# Patient Record
Sex: Male | Born: 1940
Health system: Southern US, Community
[De-identification: ages and names within clinical notes are randomized; demographics above are authoritative.]

## PROBLEM LIST (undated history)

## (undated) DIAGNOSIS — G4733 Obstructive sleep apnea (adult) (pediatric): Secondary | ICD-10-CM

## (undated) DIAGNOSIS — Z9989 Dependence on other enabling machines and devices: Secondary | ICD-10-CM

## (undated) DIAGNOSIS — I2510 Atherosclerotic heart disease of native coronary artery without angina pectoris: Secondary | ICD-10-CM

## (undated) DIAGNOSIS — E785 Hyperlipidemia, unspecified: Secondary | ICD-10-CM

## (undated) HISTORY — DX: Atherosclerotic heart disease of native coronary artery without angina pectoris: I25.10

## (undated) HISTORY — DX: Dependence on other enabling machines and devices: Z99.89

## (undated) HISTORY — DX: Hyperlipidemia, unspecified: E78.5

## (undated) HISTORY — DX: Obstructive sleep apnea (adult) (pediatric): G47.33

---

## 2000-11-30 ENCOUNTER — Emergency Department (HOSPITAL_COMMUNITY): Admission: EM | Admit: 2000-11-30 | Discharge: 2000-11-30 | Payer: Self-pay | Admitting: Internal Medicine

## 2005-05-06 ENCOUNTER — Ambulatory Visit (HOSPITAL_COMMUNITY): Admission: RE | Admit: 2005-05-06 | Discharge: 2005-05-06 | Payer: Self-pay | Admitting: Family Medicine

## 2005-05-15 ENCOUNTER — Ambulatory Visit (HOSPITAL_COMMUNITY): Admission: RE | Admit: 2005-05-15 | Discharge: 2005-05-15 | Payer: Self-pay | Admitting: Family Medicine

## 2006-06-30 ENCOUNTER — Ambulatory Visit (HOSPITAL_COMMUNITY): Admission: RE | Admit: 2006-06-30 | Discharge: 2006-06-30 | Payer: Self-pay | Admitting: Internal Medicine

## 2006-06-30 ENCOUNTER — Ambulatory Visit: Payer: Self-pay | Admitting: Internal Medicine

## 2006-07-24 ENCOUNTER — Ambulatory Visit (HOSPITAL_COMMUNITY): Admission: RE | Admit: 2006-07-24 | Discharge: 2006-07-24 | Payer: Self-pay | Admitting: *Deleted

## 2006-09-16 ENCOUNTER — Ambulatory Visit (HOSPITAL_COMMUNITY): Admission: RE | Admit: 2006-09-16 | Discharge: 2006-09-17 | Payer: Self-pay | Admitting: Cardiology

## 2006-09-16 HISTORY — PX: CORONARY ANGIOPLASTY WITH STENT PLACEMENT: SHX49

## 2006-10-19 ENCOUNTER — Ambulatory Visit: Admission: RE | Admit: 2006-10-19 | Discharge: 2006-10-19 | Payer: Self-pay | Admitting: *Deleted

## 2006-11-10 ENCOUNTER — Ambulatory Visit: Payer: Self-pay | Admitting: Pulmonary Disease

## 2007-10-29 ENCOUNTER — Encounter: Payer: Self-pay | Admitting: Pulmonary Disease

## 2007-12-04 ENCOUNTER — Encounter: Payer: Self-pay | Admitting: Pulmonary Disease

## 2007-12-21 ENCOUNTER — Ambulatory Visit: Payer: Self-pay | Admitting: Pulmonary Disease

## 2007-12-21 DIAGNOSIS — E785 Hyperlipidemia, unspecified: Secondary | ICD-10-CM | POA: Insufficient documentation

## 2007-12-21 DIAGNOSIS — I499 Cardiac arrhythmia, unspecified: Secondary | ICD-10-CM | POA: Insufficient documentation

## 2007-12-21 DIAGNOSIS — G4733 Obstructive sleep apnea (adult) (pediatric): Secondary | ICD-10-CM

## 2008-04-05 ENCOUNTER — Encounter: Payer: Self-pay | Admitting: Pulmonary Disease

## 2008-04-05 ENCOUNTER — Telehealth (INDEPENDENT_AMBULATORY_CARE_PROVIDER_SITE_OTHER): Payer: Self-pay | Admitting: *Deleted

## 2008-04-08 ENCOUNTER — Ambulatory Visit: Payer: Self-pay | Admitting: Pulmonary Disease

## 2008-04-14 ENCOUNTER — Emergency Department (HOSPITAL_COMMUNITY): Admission: EM | Admit: 2008-04-14 | Discharge: 2008-04-14 | Payer: Self-pay | Admitting: Emergency Medicine

## 2008-05-20 HISTORY — PX: US ECHOCARDIOGRAPHY: HXRAD669

## 2008-06-02 HISTORY — PX: NM MYOCAR PERF WALL MOTION: HXRAD629

## 2008-07-12 ENCOUNTER — Ambulatory Visit (HOSPITAL_COMMUNITY): Admission: RE | Admit: 2008-07-12 | Discharge: 2008-07-12 | Payer: Self-pay | Admitting: Internal Medicine

## 2010-07-02 LAB — PROTIME-INR: Prothrombin Time: 14 seconds (ref 11.6–15.2)

## 2010-07-02 LAB — HEMOGLOBIN AND HEMATOCRIT, BLOOD
HCT: 41.1 % (ref 39.0–52.0)
Hemoglobin: 14.3 g/dL (ref 13.0–17.0)

## 2010-07-31 NOTE — Cardiovascular Report (Signed)
[name], Tran                ACCOUNT NO.:  [id number]   MEDICAL RECORD NO.:  [id number]          PATIENT TYPE:  OIB   LOCATION:  6522                         FACILITY:  [facility]   PHYSICIAN:  Vonna Kotyk R. Jacinto Halim, MD       DATE OF BIRTH:  1940/08/24   DATE OF PROCEDURE:  09/16/2006  DATE OF DISCHARGE:                            CARDIAC CATHETERIZATION   REFERRING PHYSICIAN:  Catalina Pizza, M.D.   ATTENDING CARDIOLOGIST:  Dani Gobble, MD.   PROCEDURE PERFORMED:  1. Percutaneous transluminal cardiac angioplasty and stenting of the      proximal left anterior descending.  2. Percutaneous transluminal cardiac angioplasty and stenting of the      ramus intermediate coronary artery.   INDICATIONS:  Mr. Timothy Tran is a 70 year old gentleman who had  cardiac catheterization on September 02, 2006, for an abnormal stress  Myoview.  He has history of GERD, hyperlipidemia, hypertension and  palpitations.  He has been having frequent PVCs.  During diagnostic  cardiac catheterization on September 02, 2006, he was found to have a high-  grade proximal LAD and proximal ramus intermediate stenoses.  Now he is  brought to the cardiac catheterization lab for elective angioplasty to  both these vessels.   BRIEF ANGIOGRAPHIC DATA.:  Left main:  The left main is a large-caliber  vessel.  It has mild calcification.   LAD:  The LAD is a large-caliber vessel.  It has mild diffuse  calcification in the proximal segment.  There is a proximal 80% stenosis  in the proximal LAD followed by a tandem 40% stenosis.  Otherwise the  LAD has mild luminal irregularity.  It gives origin to several small  diagonals.   Ramus intermediate:  The ramus intermediate is a large-caliber vessel.  There is a high-grade proximal 99% stenosis.  It gives off a high  secondary branch and otherwise is smooth and normal.   Circumflex coronary artery:  The circumflex artery is a large-caliber  vessel.  It is smooth and normal.   INTERVENTION  DATA:  1. Successful PTCA and stenting of the proximal LAD with a 4.0 x 9 mm      MX Driver non-drug-eluting stent deployed at 12 atmospheres      pressure for 60 seconds and postdilated with a 4.0 x 8 mm Quantum      at 16 atmospheres of pressure.  The stenosis was reduced from 80%      to 0% with TIMI-3 to TIMI-3 flow maintained at the end of the      procedure.  There was no evidence of dissection or thrombus at the      end of the procedure.  2. Successful PTCA and stenting of the ramus intermediate coronary      artery with implantation of a 4.0 x 15 mm MX Driver.  This stent      was deployed at 10 atmospheres pressure for 60 seconds.  The      stenosis was reduced from 99% to 0% with TIMI-3 to TIMI-3 flow      maintained at the end  of the procedure.   RECOMMENDATIONS:  The patient will be continued on aggressive risk  modification.  He will be discharged home in the morning if he remains  stable.  A total of 165 mL of contrast was utilized for the  interventional procedure.   PROCEDURAL DATA:   EQUIPMENT UTILIZED:  7-French 3.5 CLS guide, ATW guidewire for the LAD,  Asahi Prowater into the ramus intermediate branch, 2.75 x 10 mm cutting  balloon for balloon angioplasty of the LAD and ramus intermediate,  Angiomax for anticoagulation, a 4.0 x 9 mm and 4.0 x 15 mm MX Driver  stent, and a 4.0 x 8 mm Quantum Maverick.   TECHNIQUE OF PROCEDURE:  Under usual sterile precautions, using a 7-  French right femoral arterial access, a 7-French 3.5 CLS guide was  advanced into the ascending aorta, and the left main coronary artery was  selectively engaged.  Angiography was performed.  Using Angiomax for  anticoagulation and using the ATW guidewire across the LAD,  the lesion  length was measured.  A 2.75 x 10 mm cutting balloon was utilized for  performing multiple cuts into the proximal LAD at 10 atmospheres  pressure x2.  Post cutting balloon angioplasty, which was performed for   50-60 seconds, a 4.0 x 9 mm MX Driver was advanced to the site of the  lesion and deployed at 12 atmospheres pressure for 60 seconds and  postdilated with a 4.0 x 8 mm Quantum Maverick at 14 atmospheres of  pressure for 40 seconds and 6 atmospheres of pressure for 40 seconds and  then angiography repeated.  Excellent results were noted.   Then attention was directed towards the ramus intermediate branch.  Using Asahi soft guidewire, I was able to advance through the ramus  intermediate branch with mild difficulty.  Then a 2.75 x 10 mm cutting  balloon was advanced to the site of the stenosis, and cutting balloon  angioplasty at 6 atmospheres of pressure for 45 seconds and second  inflation at 10 atmospheres of pressure for 50 seconds were performed.  After past passing the lesion with the balloon, the balloon was pre-  occlusive, and the stenosis was very critical.  After balloon  angioplasty, I decided to stent with a 4.0 x 15 mm MX Driver which was  deployed at 10 atmospheres of pressure for 60 seconds and angiography  repeated.  Excellent results were noted.  Excellent coverage of the  ostium of the ramus intermediate was noted.  No jeopardy of either the  ostial LAD or the circumflex occurred after the angioplasty.  At this  point, the guidewires were withdrawn out of the body and angiography  repeated.  Guide catheter disengaged and pulled out of body over the J-  wire.  The patient tolerated the procedure well.  No immediate  complications were noted.      Cristy Hilts. Jacinto Halim, MD  Electronically Signed     JRG/MEDQ  D:  09/16/2006  T:  09/17/2006  Job:  [id number]   cc:   Catalina Pizza, M.D.  Dani Gobble, MD

## 2010-08-03 NOTE — Op Note (Signed)
NAME:  Timothy Tran, Timothy Tran                ACCOUNT NO.:  [id number]   MEDICAL RECORD NO.:  [id number]          PATIENT TYPE:  AMB   LOCATION:  DAY                           FACILITY:  [facility]   PHYSICIAN:  R. Roetta Sessions, M.D. DATE OF BIRTH:  1940-11-15   DATE OF PROCEDURE:  06/30/2006  DATE OF DISCHARGE:                               OPERATIVE REPORT   PROCEDURE:  Screening colonoscopy.   INDICATIONS FOR PROCEDURE:  The patient is a 70 year old gentleman sent  over by the courtesy Dr. Catalina Pizza for colorectal cancer screening.  He  is devoid of any lower GI tract symptoms.  He has no family history of  colorectal neoplasia.  He has never had his lower GI tract imaged.  Colonoscopy is now being done.  This approach has been discussed with  the patient at length.  The potential risks, benefits, and alternatives  have been reviewed and questions answered.  He is agreeable.  Please see  the documentation in the medical record.   PROCEDURE:  O2 saturation, blood pressure, pulse, and respirations were  monitored throughout the entire procedure.  Conscious sedation:  Total  of Versed 3 mg IV, Demerol 75 mg IV.   Please note that the patient was somewhat bradycardic in the 50s prior  to the procedure in the short-stay center preoperative area.   Instrument:  Pentax video chip system.   FINDINGS:  Digital rectal exam revealed no abnormalities.  Endoscopic  findings:  The prep was fair to marginal.  The colonic mucosa was  surveyed from the rectosigmoid junction through the left, transverse,  and right colon to area of the appendiceal orifice, ileocecal valve, and  cecum.  These structures were eventually seen very well and photographed  for the record.  From this level, the scope was slowly withdrawn.  All  previously mentioned mucosal surfaces were again seen.  The patient had  extensive left-sided diverticula.  The remainder of the colonic mucosa  appeared normal.  Right off the bat upon  entering the sigmoid colon, the  patient became more bradycardic into the 30s and pressure dropped  transiently into the 70 systolic range.  However, he remained alert and  conversant and comfortable.  Because he was already bradycardic, I  elected to go ahead and just give him some atropine 0.25 mg.  This  resulted in increased heart rate into the 50s and 60s, and his pressure  came back up with a systolic over 100.  His colon was somewhat tortuous  and elongated, and the poor prep made the exam more difficult.  Copious  washings took place during the procedure to gain adequate visualization  of the colonic mucosa.  Again, extensive left-sided diverticula.  The  remainder of the colonic mucosa appeared normal.  The scope was pulled  down into the rectum where thorough examination of the rectal mucosa  including retroflex view of the anal verge revealed no abnormalities.  The patient tolerated the procedure well and otherwise was reactive in  endoscopy.   IMPRESSION:  1. Normal rectum.  2. Extensive left-sided diverticula.  The  remainder of the colonic      mucosa appeared normal.  3. Bradycardia before and during the procedure, as above.  He was not      bradycardic following the procedure.  I wonder if he has an element      of sick sinus syndrome.  He has really not had any cardiac-type      symptoms.  He is not on any drugs that would slow his heart rate      down.   RECOMMENDATIONS:  1. Diverticulosis literature provided to Timothy Tran.  2. Repeat screening colonoscopy in 10 years.  3. I recommend that Timothy Tran at least touch base with Dr. Margo Aye in      the near future, noting his episode of bradycardia and see if      anything else should be done for this nice gentleman.      Timothy Tran, M.D.  Electronically Signed     RMR/MEDQ  D:  06/30/2006  T:  06/30/2006  Job:  [id number]   cc:   Catalina Pizza, M.D.  Fax: [phone]

## 2010-08-03 NOTE — Procedures (Signed)
Timothy Tran, Timothy Tran                ACCOUNT NO.:  [id number]   MEDICAL RECORD NO.:  [id number]          PATIENT TYPE:  OUT   LOCATION:  SLEEP LAB                     FACILITY:  [facility]   PHYSICIAN:  Barbaraann Share, MD,FCCPDATE OF BIRTH:  08/21/1939   DATE OF STUDY:  10/29/2006                            NOCTURNAL POLYSOMNOGRAM   REFERRING PHYSICIAN:   REFERRING PHYSICIAN:  Dr. Kem Boroughs   INDICATION FOR THE STUDY:  Hypersomnia with sleep apnea.   EPWORTH SCORE:  5   SLEEP ARCHITECTURE:  The patient had a total sleep time of 315 minutes  with decreased REM as well as slow wave sleep.  Sleep onset latency was  normal and REM onset was normal as well.  Sleep efficiency was decreased  at 74%.   RESPIRATORY DATA:  The patient underwent split-night protocol where he  was noted to have 43 obstructive events in the first 105 minutes of  sleep.  This gives him an extrapolated apnea/hypopnea index of 25 events  per hour over the first half of the night.  The events were clearly  worse during supine REM and there was loud snoring noted throughout.  By  protocol, the patient was then placed on a medium Comfort Select CPAP  mask and ultimately titrated to a final pressure of 7 cm of water  pressure with good control of both events and snoring.   OXYGEN DATA:  There was O2 desaturation as low as 89% with the patient's  obstructive events.   CARDIAC DATA:  The patient was found to have sinus bradycardia  throughout but there was no significant worsening with his obstructive  events.   MOVEMENT/PARASOMNIA:  The patient was found have 228 leg jerks with 11  per hour resulting in arousal or awakening.  These occurred despite  adequate CPAP.   IMPRESSION/RECOMMENDATIONS:  1. Split-night study reveals moderate obstructive sleep apnea during      the first half of the night with an apnea/hypopnea index of 25      events per hour and oxygen desaturation as low as 89%.  The patient      was  then placed on CPAP with a medium Comfort Select nasal CPAP      mask and titrated to a final pressure of 7 cm of water pressure.  2. Sinus bradycardia noted throughout with no worsening during the      patient's obstructive events.  3. Very large numbers of leg jerks with what appears to be significant      sleep disruption.  This may simply be due to the patient's sleep      disordered breathing; however, there is some suspicion he may also      have a concomitant      primary movement disorder of sleep.  Clinical correlation is      suggested if the patient does not fully respond to optimal CPAP.      Barbaraann Share, MD,FCCP  Diplomate, American Board of Sleep  Medicine  Electronically Signed     KMC/MEDQ  D:  10/29/2006 15:45:28  T:  10/30/2006 19:45:56  Job:  [id number] 

## 2011-01-01 LAB — CBC
Hemoglobin: 13.8
MCV: 87.1
Platelets: 140 — ABNORMAL LOW

## 2011-01-01 LAB — BASIC METABOLIC PANEL
BUN: 13
CO2: 26
Chloride: 109
GFR calc Af Amer: >60

## 2011-01-01 LAB — CARDIAC PANEL(CRET KIN+CKTOT+MB+TROPI): Total CK: 95

## 2011-12-11 DIAGNOSIS — Z23 Encounter for immunization: Secondary | ICD-10-CM | POA: Diagnosis not present

## 2012-04-10 DIAGNOSIS — I2510 Atherosclerotic heart disease of native coronary artery without angina pectoris: Secondary | ICD-10-CM | POA: Diagnosis not present

## 2012-04-10 DIAGNOSIS — G4733 Obstructive sleep apnea (adult) (pediatric): Secondary | ICD-10-CM | POA: Diagnosis not present

## 2012-04-10 DIAGNOSIS — E782 Mixed hyperlipidemia: Secondary | ICD-10-CM | POA: Diagnosis not present

## 2012-06-24 DIAGNOSIS — Z79899 Other long term (current) drug therapy: Secondary | ICD-10-CM | POA: Diagnosis not present

## 2012-06-24 DIAGNOSIS — E782 Mixed hyperlipidemia: Secondary | ICD-10-CM | POA: Diagnosis not present

## 2012-12-07 DIAGNOSIS — Z23 Encounter for immunization: Secondary | ICD-10-CM | POA: Diagnosis not present

## 2013-03-23 DIAGNOSIS — H2510 Age-related nuclear cataract, unspecified eye: Secondary | ICD-10-CM | POA: Diagnosis not present

## 2013-06-10 DIAGNOSIS — R7309 Other abnormal glucose: Secondary | ICD-10-CM | POA: Diagnosis not present

## 2013-06-10 DIAGNOSIS — Z125 Encounter for screening for malignant neoplasm of prostate: Secondary | ICD-10-CM | POA: Diagnosis not present

## 2013-06-10 DIAGNOSIS — I10 Essential (primary) hypertension: Secondary | ICD-10-CM | POA: Diagnosis not present

## 2013-06-15 DIAGNOSIS — K219 Gastro-esophageal reflux disease without esophagitis: Secondary | ICD-10-CM | POA: Diagnosis not present

## 2013-06-15 DIAGNOSIS — E782 Mixed hyperlipidemia: Secondary | ICD-10-CM | POA: Diagnosis not present

## 2013-06-15 DIAGNOSIS — I2510 Atherosclerotic heart disease of native coronary artery without angina pectoris: Secondary | ICD-10-CM | POA: Diagnosis not present

## 2013-06-15 DIAGNOSIS — R7301 Impaired fasting glucose: Secondary | ICD-10-CM | POA: Diagnosis not present

## 2013-07-01 ENCOUNTER — Encounter: Payer: Self-pay | Admitting: *Deleted

## 2013-07-05 ENCOUNTER — Encounter: Payer: Self-pay | Admitting: Cardiovascular Disease

## 2013-07-05 ENCOUNTER — Ambulatory Visit (INDEPENDENT_AMBULATORY_CARE_PROVIDER_SITE_OTHER): Payer: Medicare Other | Admitting: Cardiovascular Disease

## 2013-07-05 VITALS — BP 134/82 | HR 55 | Ht 70.0 in | Wt 197.4 lb

## 2013-07-05 DIAGNOSIS — E785 Hyperlipidemia, unspecified: Secondary | ICD-10-CM | POA: Diagnosis not present

## 2013-07-05 DIAGNOSIS — I2510 Atherosclerotic heart disease of native coronary artery without angina pectoris: Secondary | ICD-10-CM

## 2013-07-05 NOTE — Assessment & Plan Note (Signed)
On statin therapy followed by his PCP 

## 2013-07-05 NOTE — Patient Instructions (Signed)
Your physician recommends that you schedule a follow-up appointment in:  One year to see Dr. Gwenlyn Found

## 2013-07-05 NOTE — Progress Notes (Signed)
07/05/2013 Timothy Tran   30-Dec-1940  [id number]  Primary Physician Delphina Cahill, MD Primary Cardiologist: Lorretta Harp MD Renae Gloss   HPI:  The patient is a delightful thin and fit-appearing 73 year old married Caucasian male, father of 33, grandfather of 59 grandchildren, whom I last saw a year ago. He has a history of CAD, status post LAD and ramus branch stenting with bare-metal stents September 16, 2006. He has mixed dyslipidemia, obstructive sleep apnea, status post nasal palatopharyngoplasty. Denies chest pain or shortness of breath.he has a history of hyperlipidemia on statin therapy followed by his PCP.   Current Outpatient Prescriptions  Medication Sig Dispense Refill  . ALPRAZolam (XANAX) 0.5 MG tablet Take 0.5 mg by mouth at bedtime as needed for anxiety.      Marland Kitchen aspirin 81 MG tablet Take 81 mg by mouth daily.      Marland Kitchen atorvastatin (LIPITOR) 40 MG tablet Take 40 mg by mouth daily.      . Cholecalciferol (VITAMIN D-3) 5000 UNITS TABS Take 5,000 Units by mouth daily.      . DULoxetine (CYMBALTA) 60 MG capsule Take 60 mg by mouth daily.      . fenofibrate 160 MG tablet Take 160 mg by mouth daily.      Marland Kitchen L-Lysine 500 MG CAPS Take 500 mg by mouth daily.      . Omega-3 Fatty Acids (FISH OIL) 1200 MG CAPS Take 1,200 mg by mouth daily.      Marland Kitchen omeprazole (PRILOSEC) 20 MG capsule Take 20 mg by mouth daily.      . tamsulosin (FLOMAX) 0.4 MG CAPS capsule Take 0.4 mg by mouth daily.      . traZODone (DESYREL) 50 MG tablet Take 50 mg by mouth daily.      Marland Kitchen zolpidem (AMBIEN CR) 12.5 MG CR tablet Take 12.5 mg by mouth at bedtime as needed for sleep.       No current facility-administered medications for this visit.    No Known Allergies  History   Social History  . Marital Status: Married    Spouse Name: N/A    Number of Children: N/A  . Years of Education: N/A   Occupational History  . Not on file.   Social History Main Topics  . Smoking status: Former Smoker   Quit date: 03/17/1980  . Smokeless tobacco: Not on file  . Alcohol Use: No  . Drug Use: No  . Sexual Activity: Not on file   Other Topics Concern  . Not on file   Social History Narrative  . No narrative on file     Review of Systems: General: negative for chills, fever, night sweats or weight changes.  Cardiovascular: negative for chest pain, dyspnea on exertion, edema, orthopnea, palpitations, paroxysmal nocturnal dyspnea or shortness of breath Dermatological: negative for rash Respiratory: negative for cough or wheezing Urologic: negative for hematuria Abdominal: negative for nausea, vomiting, diarrhea, bright red blood per rectum, melena, or hematemesis Neurologic: negative for visual changes, syncope, or dizziness All other systems reviewed and are otherwise negative except as noted above.    Blood pressure 134/82, pulse 55, height 5\' 10"  (1.778 m), weight 197 lb 6.4 oz (89.54 kg).  General appearance: alert and no distress Neck: no adenopathy, no carotid bruit, no JVD, supple, symmetrical, trachea midline and thyroid not enlarged, symmetric, no tenderness/mass/nodules Lungs: clear to auscultation bilaterally Heart: regular rate and rhythm, S1, S2 normal, no murmur, click, rub or gallop Extremities: extremities  normal, atraumatic, no cyanosis or edema  EKG sinus bradycardia at 55 without ST or T wave changes  ASSESSMENT AND PLAN:   Coronary artery disease Status post LAD and ramus branch stenting with bare-metal stent 09/16/06. Myoview performed here his liver was nonischemic. The patient denies chest pain or shortness of breath.  HYPERLIPIDEMIA On statin therapy followed by his PCP      Lorretta Harp MD [facility], [facility] 07/05/2013 11:40 AM

## 2013-07-05 NOTE — Assessment & Plan Note (Signed)
Status post LAD and ramus branch stenting with bare-metal stent 09/16/06. Myoview performed here his liver was nonischemic. The patient denies chest pain or shortness of breath.

## 2013-11-23 DIAGNOSIS — D485 Neoplasm of uncertain behavior of skin: Secondary | ICD-10-CM | POA: Diagnosis not present

## 2013-11-23 DIAGNOSIS — L570 Actinic keratosis: Secondary | ICD-10-CM | POA: Diagnosis not present

## 2013-11-23 DIAGNOSIS — L988 Other specified disorders of the skin and subcutaneous tissue: Secondary | ICD-10-CM | POA: Diagnosis not present

## 2013-11-23 DIAGNOSIS — L821 Other seborrheic keratosis: Secondary | ICD-10-CM | POA: Diagnosis not present

## 2014-01-05 DIAGNOSIS — E785 Hyperlipidemia, unspecified: Secondary | ICD-10-CM | POA: Diagnosis not present

## 2014-01-07 DIAGNOSIS — R7301 Impaired fasting glucose: Secondary | ICD-10-CM | POA: Diagnosis not present

## 2014-01-07 DIAGNOSIS — E782 Mixed hyperlipidemia: Secondary | ICD-10-CM | POA: Diagnosis not present

## 2014-01-07 DIAGNOSIS — I10 Essential (primary) hypertension: Secondary | ICD-10-CM | POA: Diagnosis not present

## 2014-01-07 DIAGNOSIS — I2510 Atherosclerotic heart disease of native coronary artery without angina pectoris: Secondary | ICD-10-CM | POA: Diagnosis not present

## 2014-07-21 DIAGNOSIS — R7301 Impaired fasting glucose: Secondary | ICD-10-CM | POA: Diagnosis not present

## 2014-07-21 DIAGNOSIS — E782 Mixed hyperlipidemia: Secondary | ICD-10-CM | POA: Diagnosis not present

## 2014-07-21 DIAGNOSIS — I10 Essential (primary) hypertension: Secondary | ICD-10-CM | POA: Diagnosis not present

## 2014-07-25 DIAGNOSIS — K219 Gastro-esophageal reflux disease without esophagitis: Secondary | ICD-10-CM | POA: Diagnosis not present

## 2014-07-25 DIAGNOSIS — E782 Mixed hyperlipidemia: Secondary | ICD-10-CM | POA: Diagnosis not present

## 2014-07-25 DIAGNOSIS — I10 Essential (primary) hypertension: Secondary | ICD-10-CM | POA: Diagnosis not present

## 2014-08-02 ENCOUNTER — Ambulatory Visit: Payer: 59 | Admitting: Cardiovascular Disease

## 2014-09-20 ENCOUNTER — Ambulatory Visit (INDEPENDENT_AMBULATORY_CARE_PROVIDER_SITE_OTHER): Payer: Medicare Other | Admitting: Cardiovascular Disease

## 2014-09-20 ENCOUNTER — Encounter: Payer: Self-pay | Admitting: Cardiovascular Disease

## 2014-09-20 VITALS — BP 132/70 | HR 53 | Ht 70.0 in | Wt 185.1 lb

## 2014-09-20 DIAGNOSIS — Z79899 Other long term (current) drug therapy: Secondary | ICD-10-CM

## 2014-09-20 DIAGNOSIS — I2510 Atherosclerotic heart disease of native coronary artery without angina pectoris: Secondary | ICD-10-CM | POA: Diagnosis not present

## 2014-09-20 DIAGNOSIS — I2583 Coronary atherosclerosis due to lipid rich plaque: Principal | ICD-10-CM

## 2014-09-20 DIAGNOSIS — E785 Hyperlipidemia, unspecified: Secondary | ICD-10-CM | POA: Diagnosis not present

## 2014-09-20 MED ORDER — ATORVASTATIN CALCIUM 80 MG PO TABS: 80.0000 mg | ORAL_TABLET | Freq: Every day | ORAL | Status: DC

## 2014-09-20 NOTE — Progress Notes (Signed)
09/20/2014 Timothy Tran   10/15/1940  [id number]  Primary Physician Delphina Cahill, MD Primary Cardiologist: Lorretta Harp MD Renae Gloss   HPI:  The patient is a delightful thin and fit-appearing 74 year old married Caucasian male, father of 62, grandfather of 23 grandchildren, whom I last saw a year ago. He is accompanied by his wife today. He has a history of CAD, status post LAD and ramus branch stenting with bare-metal stents September 16, 2006. He has mixed dyslipidemia, obstructive sleep apnea, status post nasal palatopharyngoplasty. Denies chest pain or shortness of breath.He has a history of hyperlipidemia on statin therapy followed by his PCP.his most recent lipid profile performed 07/21/14 revealed a total cholesterol 201, LDL of 134 HDL 48   Current Outpatient Prescriptions  Medication Sig Dispense Refill  . ALPRAZolam (XANAX) 0.5 MG tablet Take 0.5 mg by mouth at bedtime as needed for anxiety.    Marland Kitchen aspirin 81 MG tablet Take 81 mg by mouth daily.    Marland Kitchen atorvastatin (LIPITOR) 40 MG tablet Take 40 mg by mouth daily.    . Cholecalciferol (VITAMIN D-3) 5000 UNITS TABS Take 5,000 Units by mouth daily.    . DULoxetine (CYMBALTA) 60 MG capsule Take 60 mg by mouth daily.    . fenofibrate 160 MG tablet Take 160 mg by mouth daily.    Marland Kitchen L-Lysine 500 MG CAPS Take 500 mg by mouth daily.    . Omega-3 Fatty Acids (FISH OIL) 1200 MG CAPS Take 1,200 mg by mouth daily.    Marland Kitchen omeprazole (PRILOSEC) 20 MG capsule Take 20 mg by mouth daily.    . tamsulosin (FLOMAX) 0.4 MG CAPS capsule Take 0.4 mg by mouth daily.    . traZODone (DESYREL) 50 MG tablet Take 50 mg by mouth daily.    Marland Kitchen zolpidem (AMBIEN CR) 12.5 MG CR tablet Take 12.5 mg by mouth at bedtime as needed for sleep.     No current facility-administered medications for this visit.    No Known Allergies  History   Social History  . Marital Status: Married    Spouse Name: N/A  . Number of Children: N/A  . Years of Education: N/A     Occupational History  . Not on file.   Social History Main Topics  . Smoking status: Former Smoker    Quit date: 03/17/1980  . Smokeless tobacco: Not on file  . Alcohol Use: No  . Drug Use: No  . Sexual Activity: Not on file   Other Topics Concern  . Not on file   Social History Narrative     Review of Systems: General: negative for chills, fever, night sweats or weight changes.  Cardiovascular: negative for chest pain, dyspnea on exertion, edema, orthopnea, palpitations, paroxysmal nocturnal dyspnea or shortness of breath Dermatological: negative for rash Respiratory: negative for cough or wheezing Urologic: negative for hematuria Abdominal: negative for nausea, vomiting, diarrhea, bright red blood per rectum, melena, or hematemesis Neurologic: negative for visual changes, syncope, or dizziness All other systems reviewed and are otherwise negative except as noted above.    Blood pressure 132/70, pulse 53, height 5\' 10"  (1.778 m), weight 185 lb 1.6 oz (83.961 kg).  General appearance: alert and no distress Neck: no adenopathy, no carotid bruit, no JVD, supple, symmetrical, trachea midline and thyroid not enlarged, symmetric, no tenderness/mass/nodules Lungs: clear to auscultation bilaterally Heart: regular rate and rhythm, S1, S2 normal, no murmur, click, rub or gallop Extremities: extremities normal, atraumatic, no cyanosis or  edema  EKG sinus bradycardia 53 without ST or T-wave changes. He did have PACs. I personally reviewed this EKG  ASSESSMENT AND PLAN:   HYPERLIPIDEMIA History of hyperlipidemia on atorvastatin 40 mg a day with recent lipid profile performed 5/55/15 revealing a total cholesterol of 201, LDL 134 and HDL of 48.  Coronary artery disease History of coronary artery disease status post LAD and ramus branch stenting with bare metal stents by myself 09/16/06. He denies chest pain or shortness of breath.      Lorretta Harp MD FACP,FACC,FAHA,  [facility] 09/20/2014 10:24 AM

## 2014-09-20 NOTE — Patient Instructions (Addendum)
Dr Gwenlyn Found has recommended making the following medication changes: INCREASE Atorvastatin to 80 mg. You make take 2 tablets of your current dose until the bottle is empty. A new prescription has been sent to your pharmacy electronically for the increased dose. Please follow the instructions listed on the bottle.  Your physician recommends that you return for lab work in 2 months - FASTING.  Your physician recommends that you schedule a follow-up appointment in 1 year IN [place]. You will receive a reminder letter in the mail two months in advance. If you don't receive a letter, please call our office to schedule the follow-up appointment.

## 2014-09-20 NOTE — Assessment & Plan Note (Signed)
History of coronary artery disease status post LAD and ramus branch stenting with bare metal stents by myself 09/16/06. He denies chest pain or shortness of breath.

## 2014-09-20 NOTE — Assessment & Plan Note (Signed)
History of hyperlipidemia on atorvastatin 40 mg a day with recent lipid profile performed 5/55/15 revealing a total cholesterol of 201, LDL 134 and HDL of 48.

## 2014-10-10 ENCOUNTER — Telehealth: Payer: Self-pay | Admitting: Cardiovascular Disease

## 2014-10-10 MED ORDER — ATORVASTATIN CALCIUM 80 MG PO TABS: 80.0000 mg | ORAL_TABLET | Freq: Every day | ORAL | Status: DC

## 2014-10-10 NOTE — Telephone Encounter (Signed)
Pt saw Dr Gwenlyn Found about 2 weeks ago,he was suppose to change his cholesterol medicine to a higher dosage. When she called the pharmacy at [facility] had not recived the change.

## 2014-10-10 NOTE — Telephone Encounter (Signed)
Spoke to patient, informed me that prescription not received by [place] pharmacy. Informed caller i would resubmit to them, they voiced understanding.  Rx for new lipitor dose sent to [facility] for #90 w/ refills. E-receipt confirmed.

## 2014-11-28 DIAGNOSIS — L821 Other seborrheic keratosis: Secondary | ICD-10-CM | POA: Diagnosis not present

## 2014-11-28 DIAGNOSIS — L570 Actinic keratosis: Secondary | ICD-10-CM | POA: Diagnosis not present

## 2014-11-28 DIAGNOSIS — D485 Neoplasm of uncertain behavior of skin: Secondary | ICD-10-CM | POA: Diagnosis not present

## 2014-11-28 DIAGNOSIS — D2239 Melanocytic nevi of other parts of face: Secondary | ICD-10-CM | POA: Diagnosis not present

## 2014-12-23 DIAGNOSIS — E785 Hyperlipidemia, unspecified: Secondary | ICD-10-CM | POA: Diagnosis not present

## 2014-12-23 DIAGNOSIS — I2583 Coronary atherosclerosis due to lipid rich plaque: Secondary | ICD-10-CM | POA: Diagnosis not present

## 2014-12-23 DIAGNOSIS — I2510 Atherosclerotic heart disease of native coronary artery without angina pectoris: Secondary | ICD-10-CM | POA: Diagnosis not present

## 2014-12-23 DIAGNOSIS — Z79899 Other long term (current) drug therapy: Secondary | ICD-10-CM | POA: Diagnosis not present

## 2014-12-24 LAB — LIPID PANEL
CHOL/HDL RATIO: 2.9 ratio (ref <=5.0)
CHOLESTEROL: 138 mg/dL (ref 125–200)
HDL: 48 mg/dL (ref >=40)
LDL Cholesterol: 77 mg/dL (ref <130)
TRIGLYCERIDES: 67 mg/dL (ref <150)
VLDL: 13 mg/dL (ref <30)

## 2014-12-24 LAB — HEPATIC FUNCTION PANEL
ALBUMIN: 4.4 g/dL (ref 3.6–5.1)
ALK PHOS: 43 U/L (ref 40–115)
ALT: 23 U/L (ref 9–46)
AST: 25 U/L (ref 10–35)
BILIRUBIN TOTAL: 0.8 mg/dL (ref 0.2–1.2)
Bilirubin, Direct: 0.2 mg/dL (ref <=0.2)
Indirect Bilirubin: 0.6 mg/dL (ref 0.2–1.2)
TOTAL PROTEIN: 6.1 g/dL (ref 6.1–8.1)

## 2015-01-24 DIAGNOSIS — I10 Essential (primary) hypertension: Secondary | ICD-10-CM | POA: Diagnosis not present

## 2015-01-24 DIAGNOSIS — Z23 Encounter for immunization: Secondary | ICD-10-CM | POA: Diagnosis not present

## 2015-01-24 DIAGNOSIS — E782 Mixed hyperlipidemia: Secondary | ICD-10-CM | POA: Diagnosis not present

## 2015-01-24 DIAGNOSIS — R7301 Impaired fasting glucose: Secondary | ICD-10-CM | POA: Diagnosis not present

## 2015-01-26 DIAGNOSIS — E782 Mixed hyperlipidemia: Secondary | ICD-10-CM | POA: Diagnosis not present

## 2015-01-26 DIAGNOSIS — K219 Gastro-esophageal reflux disease without esophagitis: Secondary | ICD-10-CM | POA: Diagnosis not present

## 2015-01-26 DIAGNOSIS — R7301 Impaired fasting glucose: Secondary | ICD-10-CM | POA: Diagnosis not present

## 2015-01-26 DIAGNOSIS — I10 Essential (primary) hypertension: Secondary | ICD-10-CM | POA: Diagnosis not present

## 2015-03-04 DIAGNOSIS — F5101 Primary insomnia: Secondary | ICD-10-CM | POA: Diagnosis not present

## 2015-03-04 DIAGNOSIS — M542 Cervicalgia: Secondary | ICD-10-CM | POA: Diagnosis not present

## 2015-08-02 DIAGNOSIS — E782 Mixed hyperlipidemia: Secondary | ICD-10-CM | POA: Diagnosis not present

## 2015-08-02 DIAGNOSIS — R7301 Impaired fasting glucose: Secondary | ICD-10-CM | POA: Diagnosis not present

## 2015-08-03 DIAGNOSIS — E782 Mixed hyperlipidemia: Secondary | ICD-10-CM | POA: Diagnosis not present

## 2015-08-03 DIAGNOSIS — K219 Gastro-esophageal reflux disease without esophagitis: Secondary | ICD-10-CM | POA: Diagnosis not present

## 2015-08-03 DIAGNOSIS — I10 Essential (primary) hypertension: Secondary | ICD-10-CM | POA: Diagnosis not present

## 2015-08-03 DIAGNOSIS — R7301 Impaired fasting glucose: Secondary | ICD-10-CM | POA: Diagnosis not present

## 2015-10-13 ENCOUNTER — Ambulatory Visit (INDEPENDENT_AMBULATORY_CARE_PROVIDER_SITE_OTHER): Payer: Medicare Other | Admitting: Cardiovascular Disease

## 2015-10-13 ENCOUNTER — Encounter: Payer: Self-pay | Admitting: Cardiovascular Disease

## 2015-10-13 VITALS — BP 144/86 | HR 64 | Ht 70.0 in | Wt 194.0 lb

## 2015-10-13 DIAGNOSIS — I2510 Atherosclerotic heart disease of native coronary artery without angina pectoris: Secondary | ICD-10-CM

## 2015-10-13 DIAGNOSIS — E785 Hyperlipidemia, unspecified: Secondary | ICD-10-CM | POA: Diagnosis not present

## 2015-10-13 NOTE — Progress Notes (Signed)
SUBJECTIVE: The patient presents for annual follow-up. This is my first time meeting him. He has a history of CAD, status post LAD and ramus branch stenting with bare-metal stents September 16, 2006.   He is here with his wife and this Sunday they will have been married 57 years. 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 They have been doing so for 48 years. They have been visited by international travelers as well as [place] and [employer].  The patient denies any symptoms of chest pain, palpitations, shortness of breath, lightheadedness, dizziness, leg swelling, orthopnea, PND, and syncope.  ECG performed in the office today which I personally interpreted demonstrated sinus rhythm with PACs in a bigeminal pattern.   Review of Systems: As per "subjective", otherwise negative.  No Known Allergies  Current Outpatient Prescriptions  Medication Sig Dispense Refill  . aspirin 81 MG tablet Take 81 mg by mouth daily.    Marland Kitchen atorvastatin (LIPITOR) 80 MG tablet Take 1 tablet (80 mg total) by mouth daily. 90 tablet 3  . Cholecalciferol (VITAMIN D-3) 5000 UNITS TABS Take 5,000 Units by mouth daily.    Marland Kitchen L-Lysine 500 MG CAPS Take 500 mg by mouth daily.    . Omega-3 Fatty Acids (FISH OIL) 1200 MG CAPS Take 1,200 mg by mouth daily.    Marland Kitchen omeprazole (PRILOSEC) 20 MG capsule Take 20 mg by mouth daily.    . tamsulosin (FLOMAX) 0.4 MG CAPS capsule Take 0.4 mg by mouth daily.    . traZODone (DESYREL) 50 MG tablet Take 50 mg by mouth daily.    Marland Kitchen zolpidem (AMBIEN CR) 12.5 MG CR tablet Take 12.5 mg by mouth at bedtime as needed for sleep.     No current facility-administered medications for this visit.     Past Medical History:  Diagnosis Date  . CAD (coronary artery disease)   . Dyslipidemia   . OSA on CPAP     Past Surgical History:  Procedure Laterality Date  . CORONARY ANGIOPLASTY WITH STENT PLACEMENT  09/16/2006   stenting of the prox. LAD and ramus  intermediate coronary artery  . NM MYOCAR PERF WALL MOTION  06/02/2008   mild, small perfusion defect in the basal inferior,mid inferior & apicl inferior regions. Abnormal study. Results unchanged from previous study.  Low risk scan.  Marland Kitchen US ECHOCARDIOGRAPHY  05/20/2008   mild MR,TR and trace PI.    Social History   Social History  . Marital status: Married    Spouse name: N/A  . Number of children: N/A  . Years of education: N/A   Occupational History  . Not on file.   Social History Main Topics  . Smoking status: Former Smoker    Quit date: 03/17/1980  . Smokeless tobacco: Never Used  . Alcohol use No  . Drug use: No  . Sexual activity: Not on file   Other Topics Concern  . Not on file   Social History Narrative  . No narrative on file     Vitals:   10/13/15 0814  BP: (!) 144/86  Pulse: 64  SpO2: 97%  Weight: 194 lb (88 kg)  Height: 5\' 10"  (1.778 m)    PHYSICAL EXAM General: NAD HEENT: Normal. Neck: No JVD, no thyromegaly. Lungs: Clear to auscultation bilaterally with normal respiratory effort. CV: Nondisplaced PMI.  Regular rate and rhythm with bigeminal premature contractions, normal S1/S2, no S3/S4, no murmur. No pretibial or  periankle edema.  No carotid bruit.   Abdomen: Soft, nontender, no distention.  Neurologic: Alert and oriented.  Psych: Normal affect. Skin: Normal. Musculoskeletal: No gross deformities.    ECG: Most recent ECG reviewed.      ASSESSMENT AND PLAN: 1. CAD: Stable. Continue ASA and statin.  2. Hyperlipidemia: LDL 77 12/23/14. Continue statin.  Dispo: fu 1 year.   Kate Sable, M.D., F.A.C.C.

## 2015-10-13 NOTE — Patient Instructions (Signed)
Your physician wants you to follow-up in: 1 year Dr Koneswaran You will receive a reminder letter in the mail two months in advance. If you don't receive a letter, please call our office to schedule the follow-up appointment.     Your physician recommends that you continue on your current medications as directed. Please refer to the Current Medication list given to you today.    If you need a refill on your cardiac medications before your next appointment, please call your pharmacy.      Thank you for choosing [facility] !         

## 2015-11-06 DIAGNOSIS — R7301 Impaired fasting glucose: Secondary | ICD-10-CM | POA: Diagnosis not present

## 2015-11-06 DIAGNOSIS — E782 Mixed hyperlipidemia: Secondary | ICD-10-CM | POA: Diagnosis not present

## 2015-11-09 DIAGNOSIS — R7301 Impaired fasting glucose: Secondary | ICD-10-CM | POA: Diagnosis not present

## 2015-11-09 DIAGNOSIS — N400 Enlarged prostate without lower urinary tract symptoms: Secondary | ICD-10-CM | POA: Diagnosis not present

## 2015-11-09 DIAGNOSIS — K219 Gastro-esophageal reflux disease without esophagitis: Secondary | ICD-10-CM | POA: Diagnosis not present

## 2015-11-09 DIAGNOSIS — E782 Mixed hyperlipidemia: Secondary | ICD-10-CM | POA: Diagnosis not present

## 2015-11-09 DIAGNOSIS — I10 Essential (primary) hypertension: Secondary | ICD-10-CM | POA: Diagnosis not present

## 2015-11-17 ENCOUNTER — Other Ambulatory Visit: Payer: Self-pay

## 2016-05-28 ENCOUNTER — Emergency Department (HOSPITAL_COMMUNITY): Payer: Medicare Other

## 2016-05-28 ENCOUNTER — Encounter (HOSPITAL_COMMUNITY): Payer: Self-pay | Admitting: Emergency Medicine

## 2016-05-28 ENCOUNTER — Emergency Department (HOSPITAL_COMMUNITY)
Admission: EM | Admit: 2016-05-28 | Discharge: 2016-05-28 | Disposition: A | Discharge status: Home / Self Care | Payer: Medicare Other | Attending: Emergency Medicine | Admitting: Emergency Medicine

## 2016-05-28 DIAGNOSIS — Z955 Presence of coronary angioplasty implant and graft: Secondary | ICD-10-CM | POA: Insufficient documentation

## 2016-05-28 DIAGNOSIS — Z87891 Personal history of nicotine dependence: Secondary | ICD-10-CM | POA: Insufficient documentation

## 2016-05-28 DIAGNOSIS — R112 Nausea with vomiting, unspecified: Secondary | ICD-10-CM | POA: Diagnosis not present

## 2016-05-28 DIAGNOSIS — R404 Transient alteration of awareness: Secondary | ICD-10-CM | POA: Diagnosis not present

## 2016-05-28 DIAGNOSIS — R42 Dizziness and giddiness: Secondary | ICD-10-CM

## 2016-05-28 DIAGNOSIS — I2510 Atherosclerotic heart disease of native coronary artery without angina pectoris: Secondary | ICD-10-CM | POA: Diagnosis not present

## 2016-05-28 DIAGNOSIS — Z79899 Other long term (current) drug therapy: Secondary | ICD-10-CM | POA: Diagnosis not present

## 2016-05-28 DIAGNOSIS — Z7982 Long term (current) use of aspirin: Secondary | ICD-10-CM | POA: Diagnosis not present

## 2016-05-28 DIAGNOSIS — H6123 Impacted cerumen, bilateral: Secondary | ICD-10-CM | POA: Insufficient documentation

## 2016-05-28 DIAGNOSIS — H5500 Unspecified nystagmus: Secondary | ICD-10-CM | POA: Diagnosis not present

## 2016-05-28 DIAGNOSIS — Z5181 Encounter for therapeutic drug level monitoring: Secondary | ICD-10-CM | POA: Diagnosis not present

## 2016-05-28 LAB — COMPREHENSIVE METABOLIC PANEL
ALT: 28 U/L (ref 17–63)
ANION GAP: 5 (ref 5–15)
AST: 23 U/L (ref 15–41)
Albumin: 4.1 g/dL (ref 3.5–5.0)
Alkaline Phosphatase: 60 U/L (ref 38–126)
BILIRUBIN TOTAL: 1 mg/dL (ref 0.3–1.2)
BUN: 16 mg/dL (ref 6–20)
CO2: 28 mmol/L (ref 22–32)
Calcium: 8.5 mg/dL — ABNORMAL LOW (ref 8.9–10.3)
Chloride: 104 mmol/L (ref 101–111)
Creatinine, Ser: 0.95 mg/dL (ref 0.61–1.24)
GFR calc Af Amer: >60 mL/min (ref >60)
GLUCOSE: 137 mg/dL — AB (ref 65–99)
POTASSIUM: 4 mmol/L (ref 3.5–5.1)
Sodium: 137 mmol/L (ref 135–145)
TOTAL PROTEIN: 6.1 g/dL — AB (ref 6.5–8.1)

## 2016-05-28 LAB — CBC
HCT: 45.7 % (ref 39.0–52.0)
HEMOGLOBIN: 16.1 g/dL (ref 13.0–17.0)
MCH: 30.1 pg (ref 26.0–34.0)
MCHC: 35.2 g/dL (ref 30.0–36.0)
MCV: 85.6 fL (ref 78.0–100.0)
PLATELETS: 107 10*3/uL — AB (ref 150–400)
RBC: 5.34 MIL/uL (ref 4.22–5.81)
RDW: 13.3 % (ref 11.5–15.5)
WBC: 4.2 10*3/uL (ref 4.0–10.5)

## 2016-05-28 LAB — ETHANOL

## 2016-05-28 LAB — DIFFERENTIAL
BASOS ABS: 0 10*3/uL (ref 0.0–0.1)
BASOS PCT: 1 %
EOS ABS: 0.2 10*3/uL (ref 0.0–0.7)
EOS PCT: 4 %
LYMPHS ABS: 0.9 10*3/uL (ref 0.7–4.0)
Lymphocytes Relative: 21 %
Monocytes Absolute: 0.3 10*3/uL (ref 0.1–1.0)
Monocytes Relative: 8 %
NEUTROS PCT: 67 %
Neutro Abs: 2.8 10*3/uL (ref 1.7–7.7)

## 2016-05-28 LAB — APTT: APTT: 24 s (ref 24–36)

## 2016-05-28 LAB — PROTIME-INR
INR: 1.04
PROTHROMBIN TIME: 13.6 s (ref 11.4–15.2)

## 2016-05-28 LAB — TROPONIN I

## 2016-05-28 MED ORDER — MECLIZINE HCL 12.5 MG PO TABS: 12.5000 mg | ORAL_TABLET | Freq: Three times a day (TID) | ORAL | 0 refills | Status: DC | PRN

## 2016-05-28 MED ORDER — HYDROGEN PEROXIDE 3 % EX SOLN
CUTANEOUS | Status: AC
  Filled 2016-05-28: qty 473

## 2016-05-28 MED ORDER — MECLIZINE HCL 12.5 MG PO TABS
12.5000 mg | ORAL_TABLET | Freq: Once | ORAL | Status: AC
Start: 2016-05-28 — End: 2016-05-28
  Administered 2016-05-28: 12.5 mg via ORAL
  Filled 2016-05-28: qty 1

## 2016-05-28 NOTE — ED Triage Notes (Addendum)
Patient brought in via EMS from home. Alert and oriented. Airway patent. Patient c/o sudden onset of dizziness. Per patient feels like room is spinning. Patient reports dizziness is the same laying and standing. Patient states that dizziness has improved since arriving to ER. Denies any weakness, headache, numbness, blurred vision, or slurred speech. Patient reports having a vertigo episode "a few years ago only lasting a few minutes before resolving" but no other hx. Per patient nauseated from dizziness. Per EMS cbg 189.

## 2016-05-28 NOTE — ED Provider Notes (Signed)
[facility] DEPT Provider Note   CSN: [id number] Arrival date & time: 05/28/16  0944     History   Chief Complaint Chief Complaint  Patient presents with  . Dizziness    HPI Timothy Tran is a 76 y.o. male.  HPI Patient presents with dizziness. States that around 7:30 this morning he was out walking the dogs. When he began to feel dizzy. States it felt as if everything was spinning around. States he felt nauseous and did vomit. Came back into the house. States he feels much better now than he did then but still having some dizziness. Reportedly in the house he had a syncopal episode and had difficulty speaking. Patient states he does not remember this. No headache. No ringing in his ears. No chest pain. Has had previous stenting and coronary artery disease. No previous strokes. His been doing well the last couple days. No vision changes. No localizing numbness or weakness. States he was having some difficulty walking being unsteady when the vertigo was going on. States he took some this heart medicine because he is worried that he was dying.   Past Medical History:  Diagnosis Date  . CAD (coronary artery disease)   . Dyslipidemia   . OSA on CPAP     Patient Active Problem List   Diagnosis Date Noted  . Coronary artery disease 07/05/2013  . HYPERLIPIDEMIA 12/21/2007  . OBSTRUCTIVE SLEEP APNEA 12/21/2007  . ABNORMAL HEART RHYTHMS 12/21/2007    Past Surgical History:  Procedure Laterality Date  . CORONARY ANGIOPLASTY WITH STENT PLACEMENT  09/16/2006   stenting of the prox. LAD and ramus intermediate coronary artery  . NM MYOCAR PERF WALL MOTION  06/02/2008   mild, small perfusion defect in the basal inferior,mid inferior & apicl inferior regions. Abnormal study. Results unchanged from previous study.  Low risk scan.  Marland Kitchen US ECHOCARDIOGRAPHY  05/20/2008   mild MR,TR and trace PI.       Home Medications    Prior to Admission medications   Medication Sig Start Date End  Date Taking? Authorizing Provider  aspirin 81 MG tablet Take 81 mg by mouth daily.   Yes Historical Provider, MD  atorvastatin (LIPITOR) 40 MG tablet Take 1 tablet by mouth daily. 03/26/16  Yes Historical Provider, MD  Cholecalciferol (VITAMIN D-3) 5000 UNITS TABS Take 5,000 Units by mouth daily.   Yes Historical Provider, MD  Coenzyme Q10 (CO Q 10) 100 MG CAPS Take 1 capsule by mouth 2 (two) times daily.   Yes Historical Provider, MD  omeprazole (PRILOSEC) 20 MG capsule Take 20 mg by mouth 2 (two) times daily before a meal.    Yes Historical Provider, MD  tamsulosin (FLOMAX) 0.4 MG CAPS capsule Take 0.4 mg by mouth daily.   Yes Historical Provider, MD  meclizine (ANTIVERT) 12.5 MG tablet Take 1 tablet (12.5 mg total) by mouth 3 (three) times daily as needed for dizziness. 05/28/16   Davonna Belling, MD    Family History Family History  Problem Relation Age of Onset  . Hyperlipidemia Mother   . Hyperlipidemia Brother   . Hyperlipidemia Sister     Social History Social History  Substance Use Topics  . Smoking status: Former Smoker    Types: Cigarettes    Quit date: 03/17/1980  . Smokeless tobacco: Never Used  . Alcohol use No     Allergies   Patient has no known allergies.   Review of Systems Review of Systems  Constitutional: Negative for appetite change  and unexpected weight change.  HENT: Negative for congestion.   Eyes: Negative for visual disturbance.  Respiratory: Negative for shortness of breath.   Cardiovascular: Negative for chest pain.  Gastrointestinal: Positive for nausea and vomiting. Negative for abdominal distention.  Genitourinary: Negative for enuresis.  Musculoskeletal: Negative for back pain.  Skin: Negative for wound.  Neurological: Positive for dizziness and speech difficulty. Negative for headaches.  Hematological: Negative for adenopathy.     Physical Exam Updated Vital Signs BP 189/98 (BP Location: Right Arm)   Pulse (!) 58   Temp 97.8 F  (36.6 C)   Resp 22   Ht 5\' 10"  (1.778 m)   Wt 195 lb (88.5 kg)   SpO2 98%   BMI 27.98 kg/m   Physical Exam  Constitutional: He is oriented to person, place, and time. He appears well-developed.  HENT:  Head: Atraumatic.  Left TM obscured by cerumen. Right TM with cerumen but portion visualized normal.  Eyes: Pupils are equal, round, and reactive to light.  Nystagmus. Worse with vision to the left.   Cardiovascular: Normal rate.   Pulmonary/Chest: Effort normal.  Abdominal: Soft.  Musculoskeletal: He exhibits no edema.  Neurological: He is alert and oriented to person, place, and time.  eye movements intact with no nystagmus. Finger-nose intact bilaterally. Good grip strength bilaterally. Heel shin intact bilaterally. Face symmetric.  Skin: Skin is warm. Capillary refill takes less than 2 seconds.  Psychiatric: He has a normal mood and affect.     ED Treatments / Results  Labs (all labs ordered are listed, but only abnormal results are displayed) Labs Reviewed  CBC - Abnormal; Notable for the following:       Result Value   Platelets 107 (*)    All other components within normal limits  COMPREHENSIVE METABOLIC PANEL - Abnormal; Notable for the following:    Glucose, Bld 137 (*)    Calcium 8.5 (*)    Total Protein 6.1 (*)    All other components within normal limits  ETHANOL  PROTIME-INR  APTT  DIFFERENTIAL  TROPONIN I  RAPID URINE DRUG SCREEN, HOSP PERFORMED  URINALYSIS, ROUTINE W REFLEX MICROSCOPIC    EKG  EKG Interpretation  Date/Time:  Tuesday May 28 2016 09:59:50 EDT Ventricular Rate:  58 PR Interval:    QRS Duration: 111 QT Interval:  418 QTC Calculation: 411 R Axis:   39 Text Interpretation:  Sinus rhythm Atrial premature complex RSR' in V1 or V2, probably normal variant Confirmed by Alvino Chapel  MD, Ovid Curd [id number]) on 05/28/2016 10:08:19 AM       Radiology Mr Brain Wo Contrast  Result Date: 05/28/2016 CLINICAL DATA:  Dizziness and syncope. EXAM:  MRI HEAD WITHOUT CONTRAST TECHNIQUE: Multiplanar, multiecho pulse sequences of the brain and surrounding structures were obtained without intravenous contrast. COMPARISON:  None. FINDINGS: Brain: There is no evidence of acute infarct, intracranial hemorrhage, mass, midline shift, or extra-axial fluid collection. The ventricles and sulci are normal for age. Small foci of scattered subcortical and periventricular white matter T2 hyperintensity are nonspecific but compatible with chronic small vessel ischemic disease, very mild for age. Vascular: Major intracranial vascular flow voids are preserved. Skull and upper cervical spine: No focal marrow lesion. Sinuses/Orbits: Unremarkable orbits. Paranasal sinuses and mastoid air cells are clear. Other: None. IMPRESSION: 1. No acute intracranial abnormality. 2. Mild chronic small vessel ischemic disease. Electronically Signed   By: Logan Bores M.D.   On: 05/28/2016 10:51   Dg Chest Portable 1 View  Result  Date: 05/28/2016 CLINICAL DATA:  Dizziness and nausea. EXAM: PORTABLE CHEST 1 VIEW COMPARISON:  07/24/2006. FINDINGS: Mediastinum and hilar structures normal. Cardiomegaly. No focal infiltrate. No pleural effusion or pneumothorax. IMPRESSION: Cardiomegaly. No evidence of congestive heart failure. No acute pulmonary disease noted. Electronically Signed   By: Marcello Moores  Register   On: 05/28/2016 10:18    Procedures Procedures (including critical care time)  Medications Ordered in ED Medications  hydrogen peroxide 3 % external solution (not administered)  meclizine (ANTIVERT) tablet 12.5 mg (12.5 mg Oral Given 05/28/16 1139)     Initial Impression / Assessment and Plan / ED Course  I have reviewed the triage vital signs and the nursing notes.  Pertinent labs & imaging results that were available during my care of the patient were reviewed by me and considered in my medical decision making (see chart for details).     Patient with dizziness. Sounds like  vertigo. It was room spinning. Feels better and not severe as it was. Not a code stroke due to improvement of symptoms and likely peripheral cause of the vertigo. MRI done and reassuring. Syncope/near-syncope was right likely related to feeling bad for the vertigo and I think was not a primary cardiac cause. Will discharge home.  Final Clinical Impressions(s) / ED Diagnoses   Final diagnoses:  Vertigo    New Prescriptions New Prescriptions   MECLIZINE (ANTIVERT) 12.5 MG TABLET    Take 1 tablet (12.5 mg total) by mouth 3 (three) times daily as needed for dizziness.     Davonna Belling, MD 05/28/16 [phone]

## 2016-05-28 NOTE — ED Notes (Signed)
Pt transported to MRI 

## 2016-06-17 DIAGNOSIS — H6120 Impacted cerumen, unspecified ear: Secondary | ICD-10-CM | POA: Diagnosis not present

## 2016-06-17 DIAGNOSIS — Z6828 Body mass index (BMI) 28.0-28.9, adult: Secondary | ICD-10-CM | POA: Diagnosis not present

## 2016-06-17 DIAGNOSIS — G4700 Insomnia, unspecified: Secondary | ICD-10-CM | POA: Diagnosis not present

## 2016-06-17 DIAGNOSIS — H8110 Benign paroxysmal vertigo, unspecified ear: Secondary | ICD-10-CM | POA: Diagnosis not present

## 2016-10-31 ENCOUNTER — Ambulatory Visit (INDEPENDENT_AMBULATORY_CARE_PROVIDER_SITE_OTHER): Payer: Medicare Other | Admitting: Cardiovascular Disease

## 2016-10-31 ENCOUNTER — Encounter: Payer: Self-pay | Admitting: *Deleted

## 2016-10-31 ENCOUNTER — Encounter: Payer: Self-pay | Admitting: Cardiovascular Disease

## 2016-10-31 VITALS — BP 150/78 | HR 72 | Ht 70.0 in | Wt 196.0 lb

## 2016-10-31 DIAGNOSIS — I2510 Atherosclerotic heart disease of native coronary artery without angina pectoris: Secondary | ICD-10-CM

## 2016-10-31 DIAGNOSIS — Z79899 Other long term (current) drug therapy: Secondary | ICD-10-CM

## 2016-10-31 DIAGNOSIS — E785 Hyperlipidemia, unspecified: Secondary | ICD-10-CM | POA: Diagnosis not present

## 2016-10-31 DIAGNOSIS — I10 Essential (primary) hypertension: Secondary | ICD-10-CM | POA: Diagnosis not present

## 2016-10-31 MED ORDER — LOSARTAN POTASSIUM 25 MG PO TABS: 25.0000 mg | ORAL_TABLET | Freq: Every day | ORAL | 3 refills | Status: DC

## 2016-10-31 NOTE — Patient Instructions (Signed)
Medication Instructions:  Start Take Losartan 25 mg Daily   Labwork: Your physician recommends that you return for lab work in: 1 Week after start Losartan    Testing/Procedures: NONE  Follow-Up: Your physician wants you to follow-up in: 1 Year with Dr. Bronson Ing. You will receive a reminder letter in the mail two months in advance. If you don't receive a letter, please call our office to schedule the follow-up appointment.   Any Other Special Instructions Will Be Listed Below (If Applicable).     If you need a refill on your cardiac medications before your next appointment, please call your pharmacy.

## 2016-10-31 NOTE — Progress Notes (Signed)
SUBJECTIVE: The patient presents for annual follow-up. He has a history of CAD, status post LAD and ramus branch stenting with bare-metal stents September 16, 2006.   He is here with his wife. They have been married 66 years. 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 They have been doing so for 49 years. They have been visited by international travelers as well as [place] and [employer].  The patient denies any symptoms of chest pain, palpitations, shortness of breath, lightheadedness, dizziness, leg swelling, orthopnea, PND, and syncope.  He stays very active walking, doing yard work, and push mowing his lawn without difficulties.  ECG performed on 05/28/16 which I personally interpreted demonstrated sinus rhythm with PACs.   Review of Systems: As per "subjective", otherwise negative.  No Known Allergies  Current Outpatient Prescriptions  Medication Sig Dispense Refill  . aspirin 81 MG tablet Take 81 mg by mouth daily.    Marland Kitchen atorvastatin (LIPITOR) 40 MG tablet Take 1 tablet by mouth daily.    . Cholecalciferol (VITAMIN D-3) 5000 UNITS TABS Take 5,000 Units by mouth daily.    . Coenzyme Q10 (CO Q 10) 100 MG CAPS Take 1 capsule by mouth 2 (two) times daily.    . meclizine (ANTIVERT) 12.5 MG tablet Take 1 tablet (12.5 mg total) by mouth 3 (three) times daily as needed for dizziness. 10 tablet 0  . omeprazole (PRILOSEC) 20 MG capsule Take 20 mg by mouth 2 (two) times daily before a meal.     . tamsulosin (FLOMAX) 0.4 MG CAPS capsule Take 0.4 mg by mouth daily.     No current facility-administered medications for this visit.     Past Medical History:  Diagnosis Date  . CAD (coronary artery disease)   . Dyslipidemia   . OSA on CPAP     Past Surgical History:  Procedure Laterality Date  . CORONARY ANGIOPLASTY WITH STENT PLACEMENT  09/16/2006   stenting of the prox. LAD and ramus intermediate coronary artery  . NM MYOCAR PERF WALL MOTION   06/02/2008   mild, small perfusion defect in the basal inferior,mid inferior & apicl inferior regions. Abnormal study. Results unchanged from previous study.  Low risk scan.  Marland Kitchen US ECHOCARDIOGRAPHY  05/20/2008   mild MR,TR and trace PI.    Social History   Social History  . Marital status: Married    Spouse name: N/A  . Number of children: N/A  . Years of education: N/A   Occupational History  . Not on file.   Social History Main Topics  . Smoking status: Former Smoker    Types: Cigarettes    Quit date: 03/17/1980  . Smokeless tobacco: Never Used  . Alcohol use No  . Drug use: No  . Sexual activity: Not on file   Other Topics Concern  . Not on file   Social History Narrative  . No narrative on file     Vitals:   10/31/16 1307  BP: (!) 150/78  Pulse: 72  SpO2: 97%  Weight: 196 lb (88.9 kg)  Height: 5\' 10"  (1.778 m)    Wt Readings from Last 3 Encounters:  10/31/16 196 lb (88.9 kg)  05/28/16 195 lb (88.5 kg)  10/13/15 194 lb (88 kg)     PHYSICAL EXAM General: NAD HEENT: Normal. Neck: No JVD, no thyromegaly. Lungs: Clear to auscultation bilaterally with normal respiratory effort. CV: Nondisplaced PMI.  Regular rate and rhythm,  normal S1/S2, no S3/S4, no murmur. No pretibial or periankle edema.  No carotid bruit.   Abdomen: Soft, nontender, no distention.  Neurologic: Alert and oriented.  Psych: Normal affect. Skin: Normal. Musculoskeletal: No gross deformities.    ECG: Most recent ECG reviewed.   Labs: Lab Results  Component Value Date/Time   K 4.0 05/28/2016 10:10 AM   BUN 16 05/28/2016 10:10 AM   CREATININE 0.95 05/28/2016 10:10 AM   ALT 28 05/28/2016 10:10 AM   HGB 16.1 05/28/2016 10:10 AM     Lipids: Lab Results  Component Value Date/Time   LDLCALC 77 12/23/2014 08:14 AM   CHOL 138 12/23/2014 08:14 AM   TRIG 67 12/23/2014 08:14 AM   HDL 48 12/23/2014 08:14 AM       ASSESSMENT AND PLAN:  1. CAD: Stable. Continue ASA and  statin.  2. Hyperlipidemia: Continue statin. I will obtain a copy of lipids from PCP for review.  3. Hypertension: BP is elevated. Normal at last visit. It was 189/98 in ED on 05/28/16. I will start losartan 25 mg daily and check a BMET in one week.   Disposition: Follow up 1 yr   Kate Sable, M.D., F.A.C.C.

## 2016-11-07 ENCOUNTER — Other Ambulatory Visit (HOSPITAL_COMMUNITY)
Admission: RE | Admit: 2016-11-07 | Discharge: 2016-11-07 | Disposition: A | Discharge status: Home / Self Care | Payer: Medicare Other | Source: Ambulatory Visit | Attending: Cardiovascular Disease | Admitting: Cardiovascular Disease

## 2016-11-07 DIAGNOSIS — Z79899 Other long term (current) drug therapy: Secondary | ICD-10-CM | POA: Insufficient documentation

## 2016-11-07 LAB — BASIC METABOLIC PANEL
ANION GAP: 6 (ref 5–15)
BUN: 17 mg/dL (ref 6–20)
CALCIUM: 8.8 mg/dL — AB (ref 8.9–10.3)
CO2: 28 mmol/L (ref 22–32)
Chloride: 105 mmol/L (ref 101–111)
Creatinine, Ser: 0.96 mg/dL (ref 0.61–1.24)
Glucose, Bld: 112 mg/dL — ABNORMAL HIGH (ref 65–99)
Potassium: 4.3 mmol/L (ref 3.5–5.1)
SODIUM: 139 mmol/L (ref 135–145)

## 2017-01-20 DIAGNOSIS — Z23 Encounter for immunization: Secondary | ICD-10-CM | POA: Diagnosis not present

## 2017-01-20 DIAGNOSIS — E782 Mixed hyperlipidemia: Secondary | ICD-10-CM | POA: Diagnosis not present

## 2017-01-20 DIAGNOSIS — R7301 Impaired fasting glucose: Secondary | ICD-10-CM | POA: Diagnosis not present

## 2017-01-20 DIAGNOSIS — I10 Essential (primary) hypertension: Secondary | ICD-10-CM | POA: Diagnosis not present

## 2017-01-23 DIAGNOSIS — R7301 Impaired fasting glucose: Secondary | ICD-10-CM | POA: Diagnosis not present

## 2017-01-23 DIAGNOSIS — I10 Essential (primary) hypertension: Secondary | ICD-10-CM | POA: Diagnosis not present

## 2017-01-23 DIAGNOSIS — K219 Gastro-esophageal reflux disease without esophagitis: Secondary | ICD-10-CM | POA: Diagnosis not present

## 2017-01-23 DIAGNOSIS — Z0000 Encounter for general adult medical examination without abnormal findings: Secondary | ICD-10-CM | POA: Diagnosis not present

## 2017-01-23 DIAGNOSIS — Z6828 Body mass index (BMI) 28.0-28.9, adult: Secondary | ICD-10-CM | POA: Diagnosis not present

## 2017-01-23 DIAGNOSIS — N400 Enlarged prostate without lower urinary tract symptoms: Secondary | ICD-10-CM | POA: Diagnosis not present

## 2017-01-23 DIAGNOSIS — I25810 Atherosclerosis of coronary artery bypass graft(s) without angina pectoris: Secondary | ICD-10-CM | POA: Diagnosis not present

## 2017-01-23 DIAGNOSIS — E782 Mixed hyperlipidemia: Secondary | ICD-10-CM | POA: Diagnosis not present

## 2017-10-14 DIAGNOSIS — L570 Actinic keratosis: Secondary | ICD-10-CM | POA: Diagnosis not present

## 2018-01-01 DIAGNOSIS — Z6827 Body mass index (BMI) 27.0-27.9, adult: Secondary | ICD-10-CM | POA: Diagnosis not present

## 2018-01-01 DIAGNOSIS — R300 Dysuria: Secondary | ICD-10-CM | POA: Diagnosis not present

## 2018-01-01 DIAGNOSIS — R350 Frequency of micturition: Secondary | ICD-10-CM | POA: Diagnosis not present

## 2018-01-26 DIAGNOSIS — R300 Dysuria: Secondary | ICD-10-CM | POA: Diagnosis not present

## 2018-01-26 DIAGNOSIS — Z0000 Encounter for general adult medical examination without abnormal findings: Secondary | ICD-10-CM | POA: Diagnosis not present

## 2018-01-26 DIAGNOSIS — R7301 Impaired fasting glucose: Secondary | ICD-10-CM | POA: Diagnosis not present

## 2018-01-26 DIAGNOSIS — Z6827 Body mass index (BMI) 27.0-27.9, adult: Secondary | ICD-10-CM | POA: Diagnosis not present

## 2018-01-26 DIAGNOSIS — I10 Essential (primary) hypertension: Secondary | ICD-10-CM | POA: Diagnosis not present

## 2018-01-26 DIAGNOSIS — R350 Frequency of micturition: Secondary | ICD-10-CM | POA: Diagnosis not present

## 2018-01-26 DIAGNOSIS — E782 Mixed hyperlipidemia: Secondary | ICD-10-CM | POA: Diagnosis not present

## 2018-01-26 DIAGNOSIS — Z23 Encounter for immunization: Secondary | ICD-10-CM | POA: Diagnosis not present

## 2018-01-29 DIAGNOSIS — G4700 Insomnia, unspecified: Secondary | ICD-10-CM | POA: Diagnosis not present

## 2018-01-29 DIAGNOSIS — E782 Mixed hyperlipidemia: Secondary | ICD-10-CM | POA: Diagnosis not present

## 2018-01-29 DIAGNOSIS — Z0001 Encounter for general adult medical examination with abnormal findings: Secondary | ICD-10-CM | POA: Diagnosis not present

## 2018-01-29 DIAGNOSIS — Z0000 Encounter for general adult medical examination without abnormal findings: Secondary | ICD-10-CM | POA: Diagnosis not present

## 2018-01-29 DIAGNOSIS — N400 Enlarged prostate without lower urinary tract symptoms: Secondary | ICD-10-CM | POA: Diagnosis not present

## 2018-01-29 DIAGNOSIS — K219 Gastro-esophageal reflux disease without esophagitis: Secondary | ICD-10-CM | POA: Diagnosis not present

## 2018-01-29 DIAGNOSIS — Z1331 Encounter for screening for depression: Secondary | ICD-10-CM | POA: Diagnosis not present

## 2018-01-29 DIAGNOSIS — R7301 Impaired fasting glucose: Secondary | ICD-10-CM | POA: Diagnosis not present

## 2018-04-14 DIAGNOSIS — L821 Other seborrheic keratosis: Secondary | ICD-10-CM | POA: Diagnosis not present

## 2018-04-14 DIAGNOSIS — L570 Actinic keratosis: Secondary | ICD-10-CM | POA: Diagnosis not present

## 2018-04-14 DIAGNOSIS — D1801 Hemangioma of skin and subcutaneous tissue: Secondary | ICD-10-CM | POA: Diagnosis not present

## 2018-07-23 ENCOUNTER — Emergency Department (HOSPITAL_COMMUNITY)
Admission: EM | Admit: 2018-07-23 | Discharge: 2018-07-23 | Disposition: A | Discharge status: Home / Self Care | Payer: Medicare Other | Attending: Emergency Medicine | Admitting: Emergency Medicine

## 2018-07-23 ENCOUNTER — Encounter (HOSPITAL_COMMUNITY): Payer: Self-pay

## 2018-07-23 ENCOUNTER — Other Ambulatory Visit: Payer: Self-pay

## 2018-07-23 DIAGNOSIS — Z79899 Other long term (current) drug therapy: Secondary | ICD-10-CM | POA: Diagnosis not present

## 2018-07-23 DIAGNOSIS — T1502XA Foreign body in cornea, left eye, initial encounter: Secondary | ICD-10-CM | POA: Diagnosis not present

## 2018-07-23 DIAGNOSIS — Y9389 Activity, other specified: Secondary | ICD-10-CM | POA: Insufficient documentation

## 2018-07-23 DIAGNOSIS — H11432 Conjunctival hyperemia, left eye: Secondary | ICD-10-CM | POA: Diagnosis not present

## 2018-07-23 DIAGNOSIS — X58XXXA Exposure to other specified factors, initial encounter: Secondary | ICD-10-CM | POA: Insufficient documentation

## 2018-07-23 DIAGNOSIS — H5712 Ocular pain, left eye: Secondary | ICD-10-CM | POA: Diagnosis not present

## 2018-07-23 DIAGNOSIS — Y998 Other external cause status: Secondary | ICD-10-CM | POA: Insufficient documentation

## 2018-07-23 DIAGNOSIS — Z87891 Personal history of nicotine dependence: Secondary | ICD-10-CM | POA: Insufficient documentation

## 2018-07-23 DIAGNOSIS — Y9289 Other specified places as the place of occurrence of the external cause: Secondary | ICD-10-CM | POA: Diagnosis not present

## 2018-07-23 DIAGNOSIS — H353131 Nonexudative age-related macular degeneration, bilateral, early dry stage: Secondary | ICD-10-CM | POA: Diagnosis not present

## 2018-07-23 DIAGNOSIS — T1582XA Foreign body in other and multiple parts of external eye, left eye, initial encounter: Secondary | ICD-10-CM | POA: Diagnosis not present

## 2018-07-23 DIAGNOSIS — Z961 Presence of intraocular lens: Secondary | ICD-10-CM | POA: Diagnosis not present

## 2018-07-23 DIAGNOSIS — H35363 Drusen (degenerative) of macula, bilateral: Secondary | ICD-10-CM | POA: Diagnosis not present

## 2018-07-23 MED ORDER — CIPROFLOXACIN HCL 0.3 % OP SOLN: 2.0000 [drp] | OPHTHALMIC | 0 refills | Status: DC

## 2018-07-23 MED ORDER — TETRACAINE HCL 0.5 % OP SOLN
OPHTHALMIC | Status: AC
  Administered 2018-07-23: 11:00:00 1 [drp]
  Filled 2018-07-23: qty 4

## 2018-07-23 NOTE — ED Triage Notes (Signed)
Pt was using a saw a few days ago and feels something is stuck in his left eye. Having trouble seeing out of it. States it has been draining as well.

## 2018-07-23 NOTE — ED Provider Notes (Signed)
[facility] EMERGENCY DEPARTMENT Provider Note   CSN: [id number] Arrival date & time: 07/23/18  2458    History   Chief Complaint Chief Complaint  Patient presents with  . Eye Injury    HPI Timothy Tran is a 78 y.o. male.     Patient with no significant eye history does not wear contact lenses presents with foreign body sensation left eye for the past 2 days.  Patient was cutting and did not use his safety glasses.  Patient feels possibly metal.  Patient tetanus shot up-to-date.  No fevers or chills no vomiting no shortness of breath or cough.     Past Medical History:  Diagnosis Date  . CAD (coronary artery disease)   . Dyslipidemia   . OSA on CPAP     Patient Active Problem List   Diagnosis Date Noted  . Coronary artery disease 07/05/2013  . HYPERLIPIDEMIA 12/21/2007  . OBSTRUCTIVE SLEEP APNEA 12/21/2007  . ABNORMAL HEART RHYTHMS 12/21/2007    Past Surgical History:  Procedure Laterality Date  . CORONARY ANGIOPLASTY WITH STENT PLACEMENT  09/16/2006   stenting of the prox. LAD and ramus intermediate coronary artery  . NM MYOCAR PERF WALL MOTION  06/02/2008   mild, small perfusion defect in the basal inferior,mid inferior & apicl inferior regions. Abnormal study. Results unchanged from previous study.  Low risk scan.  Marland Kitchen US ECHOCARDIOGRAPHY  05/20/2008   mild MR,TR and trace PI.        Home Medications    Prior to Admission medications   Medication Sig Start Date End Date Taking? Authorizing Provider  aspirin 81 MG tablet Take 81 mg by mouth daily.    [provider]  atorvastatin (LIPITOR) 40 MG tablet Take 1 tablet by mouth daily. 03/26/16   [provider]  Cholecalciferol (VITAMIN D-3) 5000 UNITS TABS Take 5,000 Units by mouth daily.    [provider]  ciprofloxacin (CILOXAN) 0.3 % ophthalmic solution Place 2 drops into the left eye every 4 (four) hours while awake. Administer 1 drop, every 2 hours, while awake, for 2 days. Then 1  drop, every 4 hours, while awake, for the next 5 days. 07/23/18   Elnora Morrison, MD  Coenzyme Q10 (CO Q 10) 100 MG CAPS Take 1 capsule by mouth 2 (two) times daily.    [provider]  losartan (COZAAR) 25 MG tablet Take 1 tablet (25 mg total) by mouth daily. 10/31/16 01/29/17  Herminio Commons, MD  meclizine (ANTIVERT) 12.5 MG tablet Take 1 tablet (12.5 mg total) by mouth 3 (three) times daily as needed for dizziness. 05/28/16   Davonna Belling, MD  omeprazole (PRILOSEC) 20 MG capsule Take 20 mg by mouth 2 (two) times daily before a meal.     [provider]  tamsulosin (FLOMAX) 0.4 MG CAPS capsule Take 0.4 mg by mouth daily.    [provider]    Family History Family History  Problem Relation Age of Onset  . Hyperlipidemia Mother   . Hyperlipidemia Brother   . Hyperlipidemia Sister     Social History Social History   Tobacco Use  . Smoking status: Former Smoker    Types: Cigarettes    Last attempt to quit: 03/17/1980    Years since quitting: 38.3  . Smokeless tobacco: Never Used  Substance Use Topics  . Alcohol use: No  . Drug use: No     Allergies   Patient has no known allergies.   Review of Systems  Review of Systems  Constitutional: Negative for fever.  Eyes: Positive for pain and redness. Negative for photophobia and visual disturbance.  Gastrointestinal: Negative for vomiting.     Physical Exam Updated Vital Signs BP (!) 176/93   Pulse (!) 58   Temp 97.8 F (36.6 C)   Resp 15   Ht 5\' 8"  (1.727 m)   Wt 90.7 kg   SpO2 99%   BMI 30.41 kg/m   Physical Exam Vitals signs and nursing note reviewed.  Constitutional:      Appearance: He is well-developed.  HENT:     Head: Normocephalic and atraumatic.  Eyes:     General:        Right eye: No discharge.        Left eye: No discharge.     Comments: Patient has injected conjunctive a and foreign body approximately 1 to 2 mm at 9 o'clock position of sclera.  No drainage noted.   Extraocular muscle function intact.  Neck:     Trachea: No tracheal deviation.  Cardiovascular:     Rate and Rhythm: Normal rate.  Pulmonary:     Effort: Pulmonary effort is normal.  Skin:    General: Skin is warm.     Findings: No rash.  Neurological:     Mental Status: He is alert and oriented to person, place, and time.      ED Treatments / Results  Labs (all labs ordered are listed, but only abnormal results are displayed) Labs Reviewed - No data to display  EKG None  Radiology No results found.  Procedures Procedures (including critical care time)  Medications Ordered in ED Medications  tetracaine (PONTOCAINE) 0.5 % ophthalmic solution (1 drop  Given 07/23/18 1030)     Initial Impression / Assessment and Plan / ED Course  I have reviewed the triage vital signs and the nursing notes.  Pertinent labs & imaging results that were available during my care of the patient were reviewed by me and considered in my medical decision making (see chart for details).       Patient presents with clinically foreign body left sclera likely metal.  Given that it has been in there for 2 days I do not feel removing it in the ER will get all of the rust/metal so referring to ophthalmology.  Antibiotic eyedrops and follow-up discussed.  No signs of perforation.  Final Clinical Impressions(s) / ED Diagnoses   Final diagnoses:  Foreign body of sclera of left eye, initial encounter    ED Discharge Orders         Ordered    ciprofloxacin (CILOXAN) 0.3 % ophthalmic solution  Every 4 hours while awake     07/23/18 1044           Elnora Morrison, MD 07/23/18 1047

## 2018-07-23 NOTE — Discharge Instructions (Signed)
Follow-up closely with the eye doctor to have your foreign body removed.  Take eyedrops as directed until you see the eye doctor.  Take Tylenol as needed for pain every 4 hours.

## 2018-08-18 DIAGNOSIS — Z0000 Encounter for general adult medical examination without abnormal findings: Secondary | ICD-10-CM | POA: Diagnosis not present

## 2018-08-26 DIAGNOSIS — Z1212 Encounter for screening for malignant neoplasm of rectum: Secondary | ICD-10-CM | POA: Diagnosis not present

## 2018-08-26 DIAGNOSIS — Z1211 Encounter for screening for malignant neoplasm of colon: Secondary | ICD-10-CM | POA: Diagnosis not present

## 2018-10-16 ENCOUNTER — Other Ambulatory Visit: Payer: Self-pay

## 2018-11-29 IMAGING — MR MR HEAD W/O CM
9 of 10 series · 39 of 48 positions shown · non-contrast
Comparison: None.

CLINICAL DATA: Dizziness and syncope.

EXAM:
MRI HEAD WITHOUT CONTRAST
TECHNIQUE: Multiplanar, multiecho pulse sequences of the brain and surrounding
structures were obtained without intravenous contrast.

[Series 2: t1_fl2d_sag · sagittal · 5.0mm · 0.42mm/px · 1 of 20 slices shown]
[im 1/20]
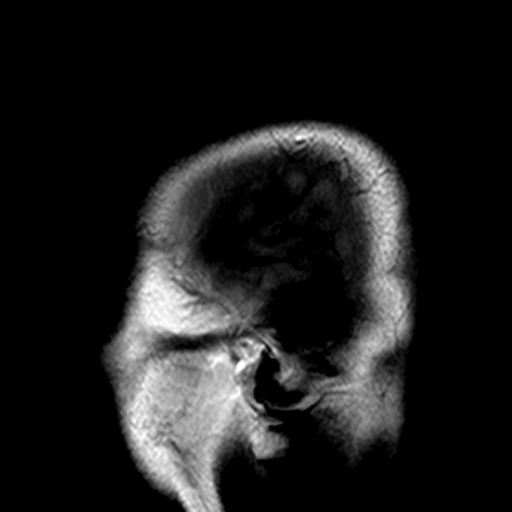

[Series 3: DWI · axial · 3.0mm · 0.82mm/px · z∈[-79,+68]mm · 6 of 50 slices shown (1 of 4)]
[im 1/50]
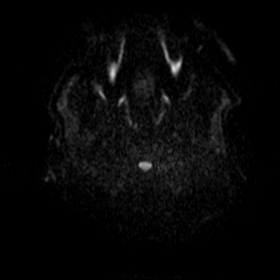
[im 10/50]
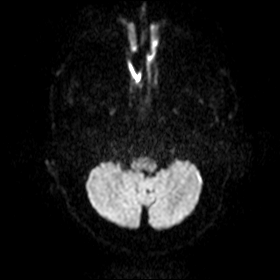
[im 20/50]
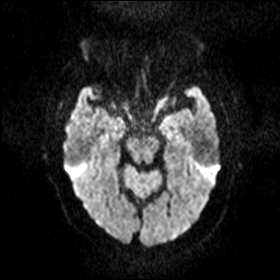
[im 30/50]
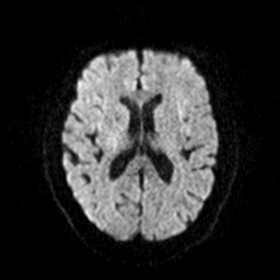
[im 40/50]
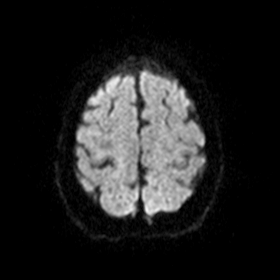
[im 50/50]
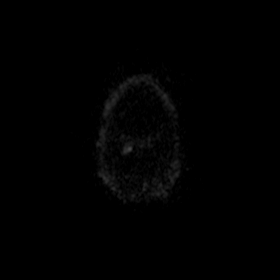

[Series 4: DWI · axial · 3.0mm · 0.82mm/px · z∈[-79,+68]mm · 6 of 50 slices shown (2 of 4)]
[im 1/50]
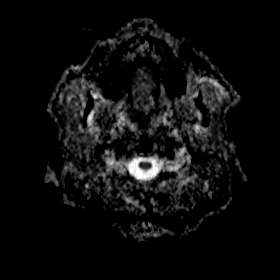
[im 10/50]
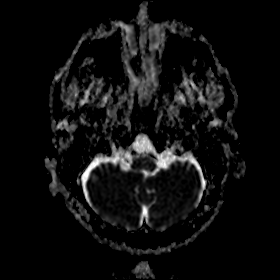
[im 20/50]
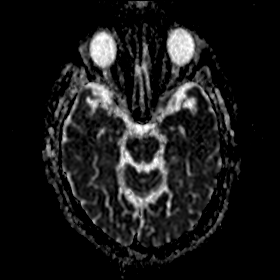
[im 30/50]
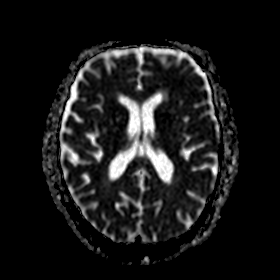
[im 40/50]
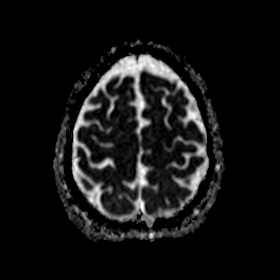
[im 50/50]
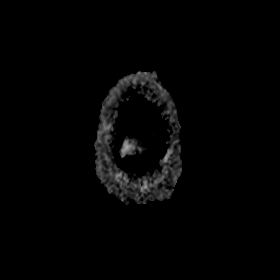

[Series 5: DWI · coronal · 5.0mm · 0.50mm/px · 4 of 34 slices shown (3 of 4)]
[im 1/34]
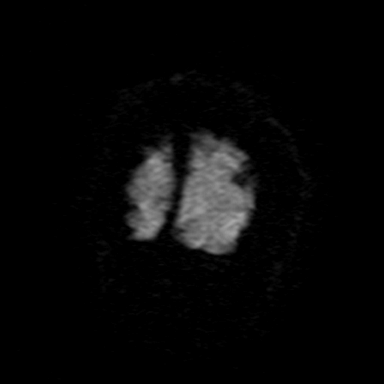
[im 12/34]
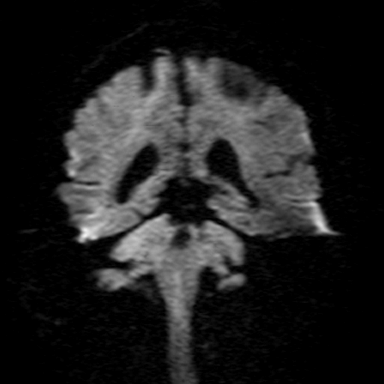
[im 23/34]
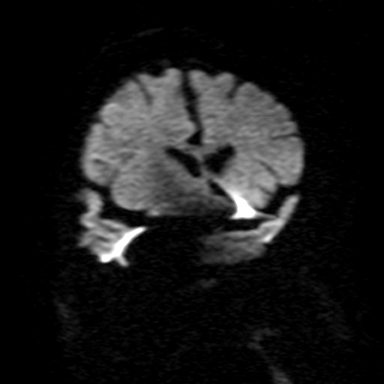
[im 34/34]
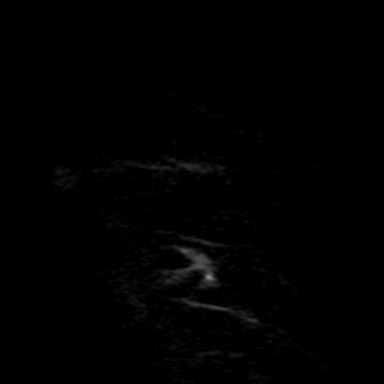

[Series 6: DWI · coronal · 5.0mm · 0.50mm/px · 4 of 34 slices shown (4 of 4)]
[im 1/34]
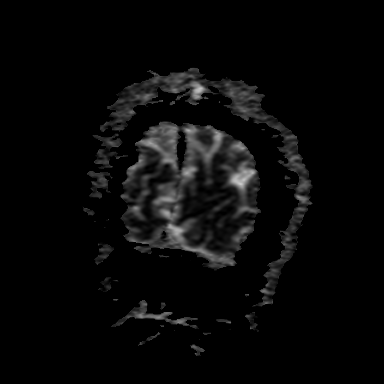
[im 12/34]
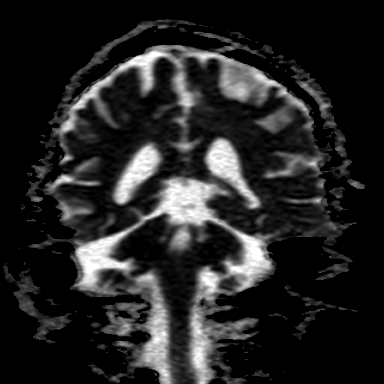
[im 23/34]
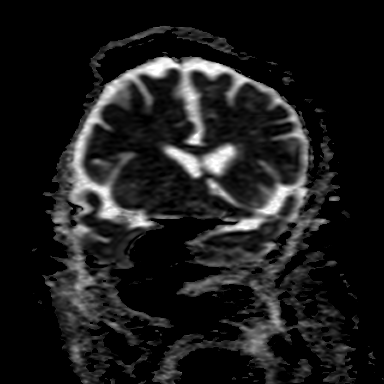
[im 34/34]
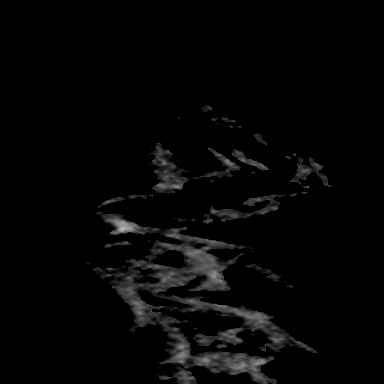

[Series 7: T2 · axial · 5.0mm · 0.75mm/px · z∈[-77,+66]mm · 3 of 23 slices shown (1 of 2)]
[im 1/23]
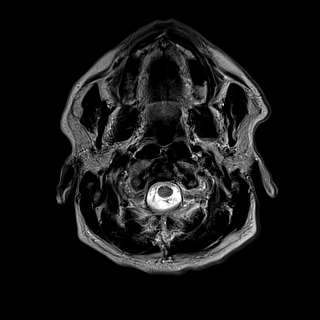
[im 12/23]
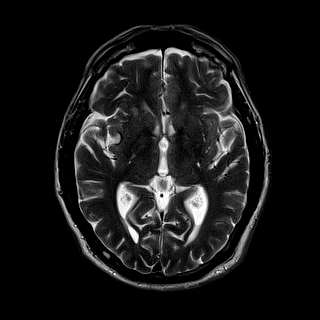
[im 23/23]
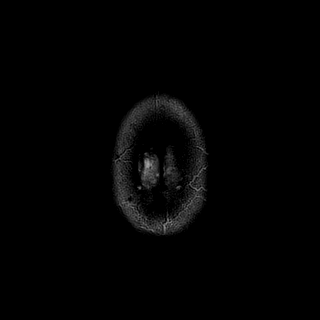

[Series 8: FLAIR · axial · 5.0mm · 0.94mm/px · z∈[-77,+66]mm · 3 of 23 slices shown]
[im 1/23]
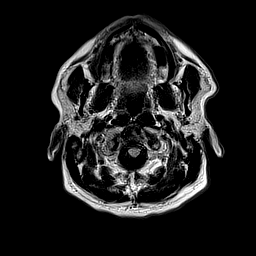
[im 12/23]
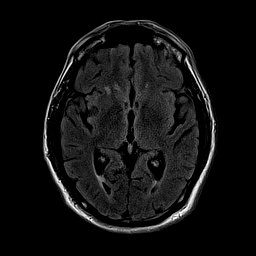
[im 23/23]
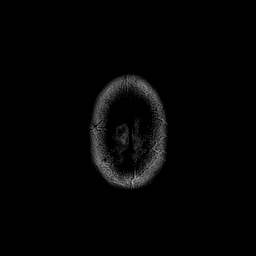

[Series 9: T1 · axial · 2.0mm · 0.47mm/px · z∈[-90,+98]mm · 8 of 95 slices shown]
[im 1/95]
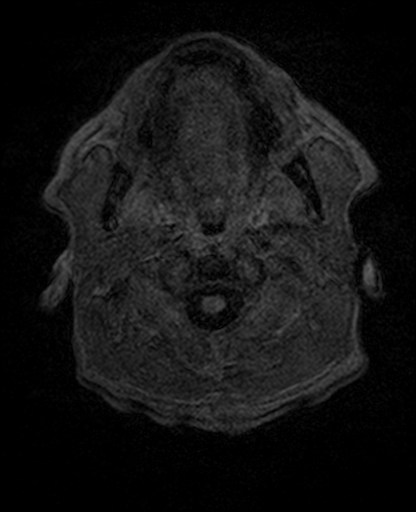
[im 18/95]
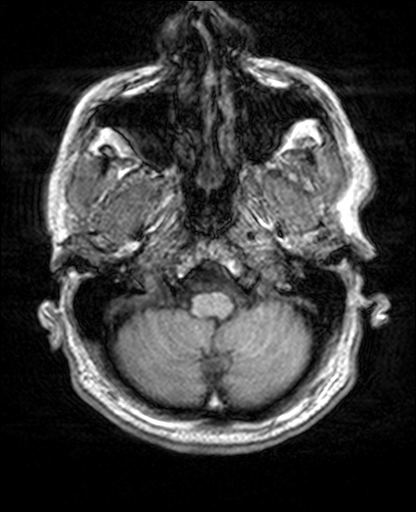
[im 26/95]
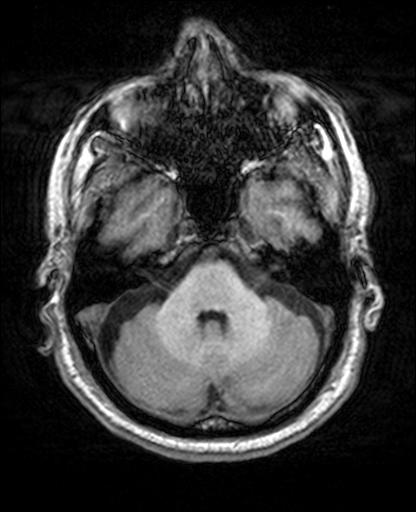
[im 43/95]
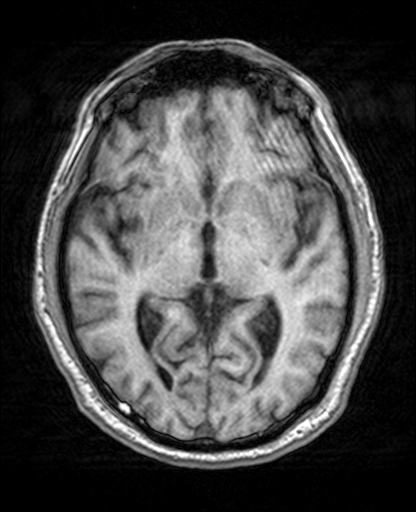
[im 52/95]
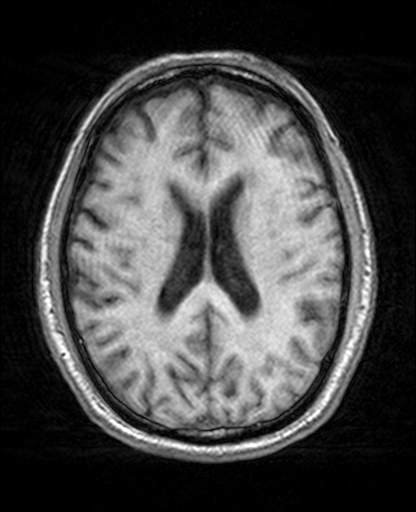
[im 69/95]
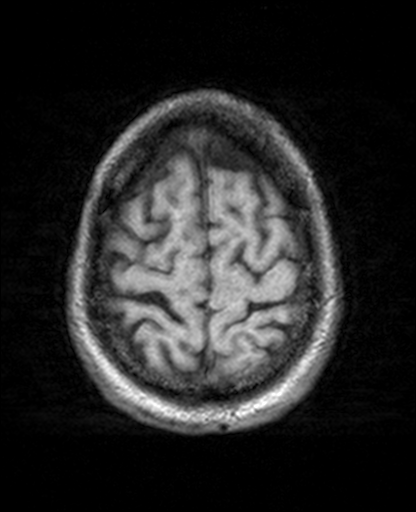
[im 77/95]
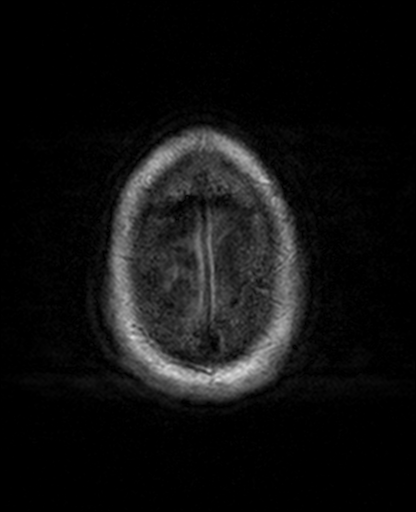
[im 95/95]
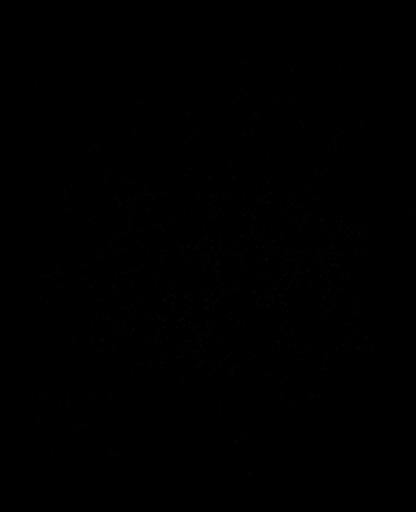

[Series 11: T2 · coronal · 5.0mm · 0.62mm/px · 4 of 28 slices shown (2 of 2)]
[im 1/28]
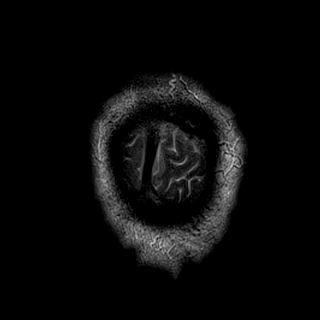
[im 10/28]
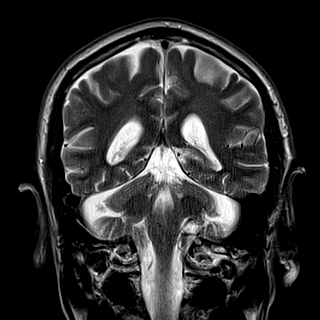
[im 19/28]
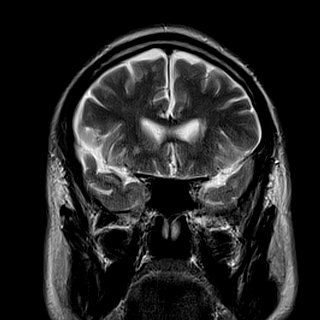
[im 28/28]
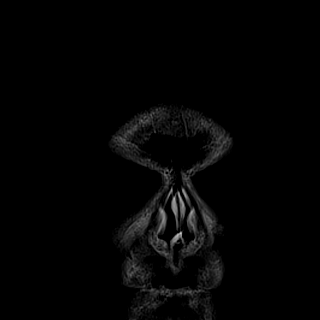

[39 of 48 positions shown; findings below may reference images not displayed]

FINDINGS: Brain: There is no evidence of acute infarct, intracranial
hemorrhage, mass, midline shift, or extra-axial fluid collection.
The ventricles and sulci are normal for age. Small foci of scattered
subcortical and periventricular white matter T2 hyperintensity are
nonspecific but compatible with chronic small vessel ischemic
disease, very mild for age.

Vascular: Major intracranial vascular flow voids are preserved.

Skull and upper cervical spine: No focal marrow lesion.

Sinuses/Orbits: Unremarkable orbits. Paranasal sinuses and mastoid
air cells are clear.

Other: None.
IMPRESSION: 1. No acute intracranial abnormality.
2. Mild chronic small vessel ischemic disease.

## 2018-12-28 ENCOUNTER — Encounter: Payer: Self-pay | Admitting: Cardiovascular Disease

## 2018-12-28 ENCOUNTER — Other Ambulatory Visit: Payer: Self-pay

## 2018-12-28 ENCOUNTER — Ambulatory Visit (INDEPENDENT_AMBULATORY_CARE_PROVIDER_SITE_OTHER): Payer: Medicare Other | Admitting: Cardiovascular Disease

## 2018-12-28 VITALS — BP 161/78 | HR 50 | Ht 70.0 in | Wt 190.4 lb

## 2018-12-28 DIAGNOSIS — I2510 Atherosclerotic heart disease of native coronary artery without angina pectoris: Secondary | ICD-10-CM

## 2018-12-28 DIAGNOSIS — I10 Essential (primary) hypertension: Secondary | ICD-10-CM

## 2018-12-28 DIAGNOSIS — E785 Hyperlipidemia, unspecified: Secondary | ICD-10-CM | POA: Diagnosis not present

## 2018-12-28 NOTE — Progress Notes (Signed)
SUBJECTIVE: The patient presents for past due follow-up.  I last evaluated him in August 2018. He has a history of CAD, status post LAD and ramus branch stenting with bare-metal stents September 16, 2006.   He has been married 34 years. 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 They have been visited by international travelers as well as [place] and [employer].  I personally reviewed the ECG performed today which demonstrates sinus rhythm with PVC.  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  The patient denies any symptoms of chest pain, palpitations, shortness of breath, lightheadedness, dizziness, leg swelling, orthopnea, PND, and syncope.   Review of Systems: As per "subjective", otherwise negative.  No Known Allergies  Current Outpatient Medications  Medication Sig Dispense Refill  . aspirin 81 MG tablet Take 81 mg by mouth daily.    Marland Kitchen atorvastatin (LIPITOR) 40 MG tablet Take 1 tablet by mouth daily.    . Cholecalciferol (VITAMIN D-3) 5000 UNITS TABS Take 5,000 Units by mouth daily.    . ciprofloxacin (CILOXAN) 0.3 % ophthalmic solution Place 2 drops into the left eye every 4 (four) hours while awake. Administer 1 drop, every 2 hours, while awake, for 2 days. Then 1 drop, every 4 hours, while awake, for the next 5 days. 5 mL 0  . Coenzyme Q10 (CO Q 10) 100 MG CAPS Take 1 capsule by mouth 2 (two) times daily.    Marland Kitchen losartan (COZAAR) 25 MG tablet Take 1 tablet (25 mg total) by mouth daily. 90 tablet 3  . meclizine (ANTIVERT) 12.5 MG tablet Take 1 tablet (12.5 mg total) by mouth 3 (three) times daily as needed for dizziness. 10 tablet 0  . omeprazole (PRILOSEC) 20 MG capsule Take 20 mg by mouth 2 (two) times daily before a meal.     . tamsulosin (FLOMAX) 0.4 MG CAPS capsule Take 0.4 mg by mouth daily.    . traZODone (DESYREL) 50 MG tablet Take 1 tablet by  mouth at bedtime.     No current facility-administered medications for this visit.     Past Medical History:  Diagnosis Date  . CAD (coronary artery disease)   . Dyslipidemia   . OSA on CPAP     Past Surgical History:  Procedure Laterality Date  . CORONARY ANGIOPLASTY WITH STENT PLACEMENT  09/16/2006   stenting of the prox. LAD and ramus intermediate coronary artery  . NM MYOCAR PERF WALL MOTION  06/02/2008   mild, small perfusion defect in the basal inferior,mid inferior & apicl inferior regions. Abnormal study. Results unchanged from previous study.  Low risk scan.  Marland Kitchen US ECHOCARDIOGRAPHY  05/20/2008   mild MR,TR and trace PI.    Social History   Socioeconomic History  . Marital status: Married    Spouse name: Not on file  . Number of children: Not on file  . Years of education: Not on file  . Highest education level: Not on file  Occupational History  . Not on file  Social Needs  . Financial resource strain: Not on file  . Food insecurity    Worry: Not on file    Inability: Not on file  . Transportation needs    Medical: Not on file    Non-medical: Not on file  Tobacco Use  . Smoking status: Former Smoker    Types:  Cigarettes    Quit date: 03/17/1980    Years since quitting: 38.8  . Smokeless tobacco: Never Used  Substance and Sexual Activity  . Alcohol use: No  . Drug use: No  . Sexual activity: Not on file  Lifestyle  . Physical activity    Days per week: Not on file    Minutes per session: Not on file  . Stress: Not on file  Relationships  . Social Herbalist on phone: Not on file    Gets together: Not on file    Attends religious service: Not on file    Active member of club or organization: Not on file    Attends meetings of clubs or organizations: Not on file    Relationship status: Not on file  . Intimate partner violence    Fear of current or ex partner: Not on file    Emotionally abused: Not on file    Physically abused: Not on file     Forced sexual activity: Not on file  Other Topics Concern  . Not on file  Social History Narrative  . Not on file     Vitals:   12/28/18 1258  BP: (!) 161/78  Pulse: (!) 50  SpO2: 97%  Weight: 190 lb 6.4 oz (86.4 kg)  Height: 5\' 10"  (1.778 m)    Wt Readings from Last 3 Encounters:  12/28/18 190 lb 6.4 oz (86.4 kg)  07/23/18 200 lb (90.7 kg)  10/31/16 196 lb (88.9 kg)     PHYSICAL EXAM General: NAD HEENT: Normal. Neck: No JVD, no thyromegaly. Lungs: Clear to auscultation bilaterally with normal respiratory effort. CV: Regular rate and rhythm with premature contractions, normal S1/S2, no S3/S4, no murmur. No pretibial or periankle edema.  No carotid bruit.   Abdomen: Soft, nontender, no distention.  Neurologic: Alert and oriented.  Psych: Normal affect. Skin: Normal. Musculoskeletal: No gross deformities.      Labs: Lab Results  Component Value Date/Time   K 4.3 11/07/2016 08:30 AM   BUN 17 11/07/2016 08:30 AM   CREATININE 0.96 11/07/2016 08:30 AM   ALT 28 05/28/2016 10:10 AM   HGB 16.1 05/28/2016 10:10 AM     Lipids: Lab Results  Component Value Date/Time   LDLCALC 77 12/23/2014 08:14 AM   CHOL 138 12/23/2014 08:14 AM   TRIG 67 12/23/2014 08:14 AM   HDL 48 12/23/2014 08:14 AM       ASSESSMENT AND PLAN:  1. CAD: Stable. Continue ASA and statin.  2. Hyperlipidemia: Continue statin. I will obtain a copy of lipids from PCP for review.  3. Hypertension: BP is markedly elevated.  I have asked him to monitor his blood pressure 3 times per week for the next 4 weeks and to inform me of these results.   Disposition: Follow up 1 yr   Kate Sable, M.D., F.A.C.C.

## 2018-12-28 NOTE — Patient Instructions (Signed)
Medication Instructions:  Continue all current medications.  Labwork: none  Testing/Procedures: none  Follow-Up: Your physician wants you to follow up in:  1 year.  You will receive a reminder letter in the mail one-two months in advance.  If you don't receive a letter, please call our office to schedule the follow up appointment   Any Other Special Instructions Will Be Listed Below (If Applicable). Your physician has requested that you regularly monitor and record your blood pressure readings at home. Please take readings 3 x per week for 1 month.  You may bring readings back to office or phone them in for MD review.   If you need a refill on your cardiac medications before your next appointment, please call your pharmacy.

## 2018-12-29 ENCOUNTER — Encounter: Payer: Self-pay | Admitting: *Deleted

## 2019-01-18 DIAGNOSIS — H25813 Combined forms of age-related cataract, bilateral: Secondary | ICD-10-CM | POA: Diagnosis not present

## 2019-02-01 DIAGNOSIS — R7301 Impaired fasting glucose: Secondary | ICD-10-CM | POA: Diagnosis not present

## 2019-02-01 DIAGNOSIS — Z23 Encounter for immunization: Secondary | ICD-10-CM | POA: Diagnosis not present

## 2019-02-01 DIAGNOSIS — E782 Mixed hyperlipidemia: Secondary | ICD-10-CM | POA: Diagnosis not present

## 2019-02-04 DIAGNOSIS — K219 Gastro-esophageal reflux disease without esophagitis: Secondary | ICD-10-CM | POA: Diagnosis not present

## 2019-02-04 DIAGNOSIS — G4700 Insomnia, unspecified: Secondary | ICD-10-CM | POA: Diagnosis not present

## 2019-02-04 DIAGNOSIS — R7301 Impaired fasting glucose: Secondary | ICD-10-CM | POA: Diagnosis not present

## 2019-02-04 DIAGNOSIS — E785 Hyperlipidemia, unspecified: Secondary | ICD-10-CM | POA: Diagnosis not present

## 2019-02-04 DIAGNOSIS — Z0001 Encounter for general adult medical examination with abnormal findings: Secondary | ICD-10-CM | POA: Diagnosis not present

## 2019-02-08 ENCOUNTER — Telehealth: Payer: Self-pay | Admitting: *Deleted

## 2019-02-08 ENCOUNTER — Other Ambulatory Visit: Payer: Self-pay

## 2019-02-08 MED ORDER — LOSARTAN POTASSIUM 50 MG PO TABS: 50.0000 mg | ORAL_TABLET | Freq: Every day | ORAL | 3 refills | Status: DC

## 2019-02-08 NOTE — Telephone Encounter (Signed)
errror

## 2019-02-08 NOTE — Telephone Encounter (Signed)
Received BP log - reviewed by Dr. Bronson Ing (see scanned into epic).  Per provider - increase Losartan to 50mg  daily.    Wife Lelon Frohlich) notified & verbalized understanding.  New prescriptions sent to [facility] at wife request.  Will have patient keep another log x 1 month since making increase to medication.

## 2019-04-02 DIAGNOSIS — I10 Essential (primary) hypertension: Secondary | ICD-10-CM | POA: Diagnosis not present

## 2019-04-02 DIAGNOSIS — K219 Gastro-esophageal reflux disease without esophagitis: Secondary | ICD-10-CM | POA: Diagnosis not present

## 2019-04-02 DIAGNOSIS — R7301 Impaired fasting glucose: Secondary | ICD-10-CM | POA: Diagnosis not present

## 2019-04-02 DIAGNOSIS — I25810 Atherosclerosis of coronary artery bypass graft(s) without angina pectoris: Secondary | ICD-10-CM | POA: Diagnosis not present

## 2019-04-02 DIAGNOSIS — E7849 Other hyperlipidemia: Secondary | ICD-10-CM | POA: Diagnosis not present

## 2019-04-02 DIAGNOSIS — N400 Enlarged prostate without lower urinary tract symptoms: Secondary | ICD-10-CM | POA: Diagnosis not present

## 2019-04-19 DIAGNOSIS — Z23 Encounter for immunization: Secondary | ICD-10-CM | POA: Diagnosis not present

## 2019-06-21 DIAGNOSIS — H25813 Combined forms of age-related cataract, bilateral: Secondary | ICD-10-CM | POA: Diagnosis not present

## 2019-06-21 DIAGNOSIS — H18513 Endothelial corneal dystrophy, bilateral: Secondary | ICD-10-CM | POA: Diagnosis not present

## 2019-07-14 NOTE — H&P (Signed)
Surgical History & Physical  Patient Name: Timothy Tran DOB: 1940/10/04  Surgery: Cataract extraction with intraocular lens implant phacoemulsification; Left Eye  Surgeon: Baruch Goldmann MD Surgery Date:  07/23/2019 Pre-Op Date:  07/14/2019  HPI: A 70 Yr. old male patient is referred by Dr Jorja Loa for cataract eval 1. 1. The patient complains of difficulty when viewing TV, reading closed caption, news scrolls on TV, which began 4 months ago. Both eyes are affected. The episode is gradual. The condition's severity increased since last visit. Symptoms occur when the patient is inside, outside and reading. Pt states in the morning vision is good but in the afternoon he is having diff seeing clear in both eyes. This is negatively affecting the patient's quality of life. HPI was performed by Baruch Goldmann .  Medical History: Cataracts Heart Problem High Blood Pressure LDL  Review of Systems: Negative Allergic/Immunologic Negative Cardiovascular Negative Constitutional Negative Ear, Nose, Mouth & Throat Negative Endocrine Negative Eyes Negative Gastrointestinal Negative Genitourinary Negative Hemotologic/Lymphatic Negative Integumentary Negative Musculoskeletal Negative Neurological Negative Psychiatry Negative Respiratory  Social   Former smoker   Medication Tamsulosin, Trazodone, Omeprazole, Atorvastatin, Losartan Potassium, Aspirin, Zinc, Magnesium, Vitamin D2, ESTER-C,   Sx/Procedures Heart Stents,   Drug Allergies   NKDA  History & Physical: Heent:  Cataract, Left eye NECK: supple without bruits LUNGS: lungs clear to auscultation CV: regular rate and rhythm Abdomen: soft and non-tender Impression & Plan: Assessment: 1.  COMBINED FORMS AGE RELATED CATARACT; Both Eyes (H25.813) 2.  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  Cataract accounts for the patient's decreased vision. This visual impairment is not correctable with a tolerable change in  glasses or contact lenses. Cataract surgery with an implantation of a new lens should significantly improve the visual and functional status of the patient. Discussed all risks, benefits, alternatives, and potential complications. Discussed the procedures and recovery. Patient desires to have surgery. A-scan ordered and performed today for intra-ocular lens calculations. The surgery will be performed in order to improve vision for driving, reading, and for eye examinations. Recommend phacoemulsification with intra-ocular lens. Left Eye worse - first. Dilates poorly - shugacaine by protocol. Malyugin Ring at ready. Omidira. Flomax. 2.  mild - monitor.

## 2019-07-19 DIAGNOSIS — H25812 Combined forms of age-related cataract, left eye: Secondary | ICD-10-CM | POA: Diagnosis not present

## 2019-07-21 ENCOUNTER — Other Ambulatory Visit (HOSPITAL_COMMUNITY)
Admission: RE | Admit: 2019-07-21 | Discharge: 2019-07-21 | Disposition: A | Discharge status: Home / Self Care | Payer: Medicare Other | Source: Ambulatory Visit | Attending: Ophthalmology | Admitting: Ophthalmology

## 2019-07-21 ENCOUNTER — Encounter (HOSPITAL_COMMUNITY): Payer: Self-pay

## 2019-07-21 ENCOUNTER — Encounter (HOSPITAL_COMMUNITY)
Admission: RE | Admit: 2019-07-21 | Discharge: 2019-07-21 | Disposition: A | Discharge status: Home / Self Care | Payer: Medicare Other | Source: Ambulatory Visit | Attending: Ophthalmology | Admitting: Ophthalmology

## 2019-07-21 ENCOUNTER — Other Ambulatory Visit: Payer: Self-pay

## 2019-07-21 DIAGNOSIS — Z01812 Encounter for preprocedural laboratory examination: Secondary | ICD-10-CM | POA: Insufficient documentation

## 2019-07-21 DIAGNOSIS — Z20822 Contact with and (suspected) exposure to covid-19: Secondary | ICD-10-CM | POA: Insufficient documentation

## 2019-07-22 LAB — SARS CORONAVIRUS 2 (TAT 6-24 HRS): SARS Coronavirus 2: NEGATIVE

## 2019-07-23 ENCOUNTER — Ambulatory Visit (HOSPITAL_COMMUNITY): Payer: Medicare Other | Admitting: Anesthesiology

## 2019-07-23 ENCOUNTER — Encounter (HOSPITAL_COMMUNITY): Payer: Self-pay | Admitting: Ophthalmology

## 2019-07-23 ENCOUNTER — Ambulatory Visit (HOSPITAL_COMMUNITY)
Admission: RE | Admit: 2019-07-23 | Discharge: 2019-07-23 | Disposition: A | Discharge status: Home / Self Care | Payer: Medicare Other | Attending: Ophthalmology | Admitting: Ophthalmology

## 2019-07-23 ENCOUNTER — Encounter (HOSPITAL_COMMUNITY)
Admission: RE | Disposition: A | Discharge status: Home / Self Care | Payer: Self-pay | Source: Home / Self Care | Attending: Ophthalmology

## 2019-07-23 DIAGNOSIS — E7800 Pure hypercholesterolemia, unspecified: Secondary | ICD-10-CM | POA: Insufficient documentation

## 2019-07-23 DIAGNOSIS — H18513 Endothelial corneal dystrophy, bilateral: Secondary | ICD-10-CM | POA: Insufficient documentation

## 2019-07-23 DIAGNOSIS — Z955 Presence of coronary angioplasty implant and graft: Secondary | ICD-10-CM | POA: Insufficient documentation

## 2019-07-23 DIAGNOSIS — H2181 Floppy iris syndrome: Secondary | ICD-10-CM | POA: Insufficient documentation

## 2019-07-23 DIAGNOSIS — H2512 Age-related nuclear cataract, left eye: Secondary | ICD-10-CM | POA: Diagnosis not present

## 2019-07-23 DIAGNOSIS — I2510 Atherosclerotic heart disease of native coronary artery without angina pectoris: Secondary | ICD-10-CM | POA: Diagnosis not present

## 2019-07-23 DIAGNOSIS — H25813 Combined forms of age-related cataract, bilateral: Secondary | ICD-10-CM | POA: Diagnosis not present

## 2019-07-23 DIAGNOSIS — Z87891 Personal history of nicotine dependence: Secondary | ICD-10-CM | POA: Diagnosis not present

## 2019-07-23 DIAGNOSIS — Z79899 Other long term (current) drug therapy: Secondary | ICD-10-CM | POA: Diagnosis not present

## 2019-07-23 DIAGNOSIS — H25812 Combined forms of age-related cataract, left eye: Secondary | ICD-10-CM | POA: Diagnosis not present

## 2019-07-23 DIAGNOSIS — I10 Essential (primary) hypertension: Secondary | ICD-10-CM | POA: Diagnosis not present

## 2019-07-23 DIAGNOSIS — Z7982 Long term (current) use of aspirin: Secondary | ICD-10-CM | POA: Insufficient documentation

## 2019-07-23 HISTORY — PX: CATARACT EXTRACTION W/PHACO: SHX586

## 2019-07-23 SURGERY — PHACOEMULSIFICATION, CATARACT, WITH IOL INSERTION
Anesthesia: Monitor Anesthesia Care | Site: Eye | Laterality: Left

## 2019-07-23 MED ORDER — PROVISC 10 MG/ML IO SOLN
INTRAOCULAR | Status: DC | PRN
  Administered 2019-07-23: 0.85 mL via INTRAOCULAR

## 2019-07-23 MED ORDER — PHENYLEPHRINE-KETOROLAC 1-0.3 % IO SOLN
INTRAOCULAR | Status: AC
  Filled 2019-07-23: qty 4

## 2019-07-23 MED ORDER — LIDOCAINE 3.5 % OP GEL OPTIME - NO CHARGE
OPHTHALMIC | Status: DC | PRN
  Administered 2019-07-23: 10:00:00 1 [drp] via OPHTHALMIC

## 2019-07-23 MED ORDER — BSS IO SOLN
INTRAOCULAR | Status: DC | PRN
  Administered 2019-07-23: 15 mL via INTRAOCULAR

## 2019-07-23 MED ORDER — DEXAMETHASONE 0.4 MG OP INST
VAGINAL_INSERT | OPHTHALMIC | Status: DC | PRN
  Administered 2019-07-23: 0.4 mg via OPHTHALMIC

## 2019-07-23 MED ORDER — DEXAMETHASONE 0.4 MG OP INST
VAGINAL_INSERT | OPHTHALMIC | Status: AC
  Filled 2019-07-23: qty 1

## 2019-07-23 MED ORDER — FENTANYL CITRATE (PF) 100 MCG/2ML IJ SOLN
INTRAMUSCULAR | Status: AC
  Filled 2019-07-23: qty 2

## 2019-07-23 MED ORDER — LIDOCAINE HCL (PF) 1 % IJ SOLN
INTRAOCULAR | Status: DC | PRN
  Administered 2019-07-23: 1 mL via OPHTHALMIC

## 2019-07-23 MED ORDER — SODIUM HYALURONATE 23 MG/ML IO SOLN
INTRAOCULAR | Status: DC | PRN
  Administered 2019-07-23: 0.6 mL via INTRAOCULAR

## 2019-07-23 MED ORDER — PHENYLEPHRINE HCL 2.5 % OP SOLN
1.0000 [drp] | OPHTHALMIC | Status: AC | PRN
  Administered 2019-07-23 (×3): 1 [drp] via OPHTHALMIC

## 2019-07-23 MED ORDER — POVIDONE-IODINE 5 % OP SOLN
OPHTHALMIC | Status: DC | PRN
  Administered 2019-07-23: 1 via OPHTHALMIC

## 2019-07-23 MED ORDER — CYCLOPENTOLATE-PHENYLEPHRINE 0.2-1 % OP SOLN
1.0000 [drp] | OPHTHALMIC | Status: AC | PRN
  Administered 2019-07-23 (×3): 1 [drp] via OPHTHALMIC

## 2019-07-23 MED ORDER — LIDOCAINE HCL 3.5 % OP GEL
1.0000 "application " | Freq: Once | OPHTHALMIC | Status: AC
  Administered 2019-07-23: 1 via OPHTHALMIC

## 2019-07-23 MED ORDER — PHENYLEPHRINE-KETOROLAC 1-0.3 % IO SOLN
INTRAOCULAR | Status: DC | PRN
  Administered 2019-07-23: 10:00:00 500 mL via OPHTHALMIC

## 2019-07-23 MED ORDER — TETRACAINE HCL 0.5 % OP SOLN
1.0000 [drp] | OPHTHALMIC | Status: AC | PRN
  Administered 2019-07-23 (×3): 1 [drp] via OPHTHALMIC

## 2019-07-23 SURGICAL SUPPLY — 14 items
CLOTH BEACON ORANGE TIMEOUT ST (SAFETY) ×2 IMPLANT
EYE SHIELD UNIVERSAL CLEAR (GAUZE/BANDAGES/DRESSINGS) ×2 IMPLANT
GLOVE BIOGEL PI IND STRL 7.0 (GLOVE) IMPLANT
GLOVE BIOGEL PI INDICATOR 7.0 (GLOVE) ×4
LENS ALC ACRYL/TECN (Ophthalmic Related) ×2 IMPLANT
NDL HYPO 18GX1.5 BLUNT FILL (NEEDLE) IMPLANT
NEEDLE HYPO 18GX1.5 BLUNT FILL (NEEDLE) ×3 IMPLANT
PAD ARMBOARD 7.5X6 YLW CONV (MISCELLANEOUS) ×2 IMPLANT
RING MALYGIN 7.0 (MISCELLANEOUS) ×2 IMPLANT
SYR TB 1ML LL NO SAFETY (SYRINGE) ×2 IMPLANT
TAPE SURG TRANSPORE 1 IN (GAUZE/BANDAGES/DRESSINGS) IMPLANT
TAPE SURGICAL TRANSPORE 1 IN (GAUZE/BANDAGES/DRESSINGS) ×3
VISCOELASTIC ADDITIONAL (OPHTHALMIC RELATED) ×2 IMPLANT
WATER STERILE IRR 250ML POUR (IV SOLUTION) ×2 IMPLANT

## 2019-07-23 NOTE — Addendum Note (Signed)
Addendum  created 07/23/19 1345 by Vista Deck, CRNA   Charge Capture section accepted

## 2019-07-23 NOTE — Anesthesia Preprocedure Evaluation (Signed)
Anesthesia Evaluation  Patient identified by MRN, date of birth, ID band Patient awake    Reviewed: Allergy & Precautions, H&P , NPO status , Patient's Chart, lab work & pertinent test results, reviewed documented beta blocker date and time   Airway Mallampati: II  TM Distance: >3 FB Neck ROM: full    Dental no notable dental hx. (+) Upper Dentures, Lower Dentures   Pulmonary neg pulmonary ROS, former smoker,    Pulmonary exam normal breath sounds clear to auscultation       Cardiovascular Exercise Tolerance: Good + CAD   Rhythm:regular Rate:Normal     Neuro/Psych negative neurological ROS  negative psych ROS   GI/Hepatic negative GI ROS, Neg liver ROS,   Endo/Other  negative endocrine ROS  Renal/GU negative Renal ROS  negative genitourinary   Musculoskeletal   Abdominal   Peds  Hematology negative hematology ROS (+)   Anesthesia Other Findings   Reproductive/Obstetrics negative OB ROS                             Anesthesia Physical Anesthesia Plan  ASA: III  Anesthesia Plan: MAC   Post-op Pain Management:    Induction:   PONV Risk Score and Plan:   Airway Management Planned:   Additional Equipment:   Intra-op Plan:   Post-operative Plan:   Informed Consent: I have reviewed the patients History and Physical, chart, labs and discussed the procedure including the risks, benefits and alternatives for the proposed anesthesia with the patient or authorized representative who has indicated his/her understanding and acceptance.     Dental Advisory Given  Plan Discussed with: CRNA  Anesthesia Plan Comments:         Anesthesia Quick Evaluation

## 2019-07-23 NOTE — Interval H&P Note (Signed)
History and Physical Interval Note: The H and P was reviewed and updated. The patient was examined.  No changes were found after exam.  The surgical eye was marked.  07/23/2019 9:49 AM  Timothy Tran  has presented today for surgery, with the diagnosis of Nuclear sclerotic cataract - Left eye.  The various methods of treatment have been discussed with the patient and family. After consideration of risks, benefits and other options for treatment, the patient has consented to  Procedure(s) with comments: CATARACT EXTRACTION PHACO AND INTRAOCULAR LENS PLACEMENT ([place]) (Left) - left as a surgical intervention.  The patient's history has been reviewed, patient examined, no change in status, stable for surgery.  I have reviewed the patient's chart and labs.  Questions were answered to the patient's satisfaction.     Baruch Goldmann

## 2019-07-23 NOTE — Op Note (Signed)
Date of procedure: 07/23/19  Pre-operative diagnosis: Visually significant cataract, Left Eye; Poor dilation, Left eye (H25.812; H21.81)  Post-operative diagnosis: Visually significant cataract, Left Eye; Intra-operative Floppy Iris Syndrome, Left Eye  Procedure:  1.Complex removal of cataract via phacoemulsification and insertion of intra-ocular lens [place]  +25.5D into the capsular bag of the Left Eye (CPT [phone]) 2. Placement of [place], Left Eye  Attending surgeon: Gerda Diss. Zandria Woldt, MD, MA  Anesthesia: MAC, Topical [place]  Complications: None  Estimated Blood Loss: <74m (minimal)  Specimens: None  Implants: As above  Indications:  Visually significant cataract, Left Eye  Procedure:  The patient was seen and identified in the pre-operative area. The operative eye was identified and dilated.  The operative eye was marked.  Topical anesthesia was administered to the operative eye.     The patient was then to the operative suite and placed in the supine position.  A timeout was performed confirming the patient, procedure to be performed, and all other relevant information.   The patient's face was prepped and draped in the usual fashion for intra-ocular surgery.  A lid speculum was placed into the operative eye and the surgical microscope moved into place and focused.  Poor dilation of the iris was confirmed.  An inferotemporal paracentesis was created using a 20 gauge paracentesis blade.  Shugarcaine was injected into the anterior chamber.  Viscoelastic was injected into the anterior chamber.  A temporal clear-corneal main wound incision was created using a 2.4106mmicrokeratome.  A Malyugin ring was placed.  A continuous curvilinear capsulorrhexis was initiated using an irrigating cystitome and completed using capsulorrhexis forceps.  Hydrodissection and hydrodeliniation were performed.  Viscoelastic was injected into the anterior chamber.  A phacoemulsification  handpiece and a chopper as a second instrument were used to remove the nucleus and epinucleus. The irrigation/aspiration handpiece was used to remove any remaining cortical material.   The capsular bag was reinflated with viscoelastic, checked, and found to be intact.  The intraocular lens was inserted into the capsular bag and dialed into place using a Kuglen hook. The Malyugin ring was removed.  The irrigation/aspiration handpiece was used to remove any remaining viscoelastic.  The clear corneal wound and paracentesis wounds were then hydrated and checked with Weck-Cels to be watertight.  The lid-speculum and drape was removed.  A Dextenza implant was placed in the canaliculus.The patient's face was cleaned with a wet and dry 4x4.  Maxitrol was instilled in the eye before a clear shield was taped over the eye. The patient was taken to the post-operative care unit in good condition, having tolerated the procedure well.  Post-Op Instructions: [facility] and will receive all other orders and instructions.

## 2019-07-23 NOTE — Transfer of Care (Signed)
Immediate Anesthesia Transfer of Care Note  Patient: Timothy Tran  Procedure(s) Performed: CATARACT EXTRACTION PHACO AND INTRAOCULAR LENS PLACEMENT (IOC) (Left Eye)  Patient Location: PACU  Anesthesia Type:MAC  Level of Consciousness: awake, alert , oriented and patient cooperative  Airway & Oxygen Therapy: Patient Spontanous Breathing  Post-op Assessment: Report given to RN, Post -op Vital signs reviewed and stable and Patient moving all extremities X 4  Post vital signs: Reviewed and stable  Last Vitals:  Vitals Value Taken Time  BP    Temp    Pulse    Resp    SpO2      Last Pain:  Vitals:   07/23/19 0851  TempSrc: Oral  PainSc: 0-No pain      Patients Stated Pain Goal: 8 (35/00/93 8182)  Complications: No apparent anesthesia complications

## 2019-07-23 NOTE — Anesthesia Postprocedure Evaluation (Signed)
Anesthesia Post Note  Patient: Timothy Tran  Procedure(s) Performed: CATARACT EXTRACTION PHACO AND INTRAOCULAR LENS PLACEMENT (IOC) (Left Eye)  Patient location during evaluation: Phase II Anesthesia Type: MAC Level of consciousness: awake and alert Pain management: pain level controlled Vital Signs Assessment: post-procedure vital signs reviewed and stable Respiratory status: spontaneous breathing, nonlabored ventilation, respiratory function stable and patient connected to nasal cannula oxygen Cardiovascular status: stable and blood pressure returned to baseline Postop Assessment: no apparent nausea or vomiting Anesthetic complications: no     Last Vitals:  Vitals:   07/23/19 0851  BP: (!) 161/79  Pulse: (!) 49  Resp: 14  Temp: 36.8 C  SpO2: 99%    Last Pain:  Vitals:   07/23/19 0851  TempSrc: Oral  PainSc: 0-No pain                 Timothy Tran

## 2019-07-23 NOTE — Discharge Instructions (Signed)
Please discharge patient when stable, will follow up today with Dr. Jada Fass at the [facility] [place] office immediately following discharge.  Leave shield in place until visit.  All paperwork with discharge instructions will be given at the office.  [facility] [place] Address:  [address]  [place], [place] [address]  

## 2019-07-30 NOTE — Patient Instructions (Signed)
    Timothy Tran [place]  07/30/2019     @PREFPERIOPPHARMACY @   Your procedure is scheduled on  08/06/2019 .  Report to [facility] at  [phone]  A.M.  Call this number if you have problems the morning of surgery:  [phone]   Remember:  Do not eat or drink after midnight.                      Take these medicines the morning of surgery with A SIP OF WATER  Prilosec, flomax.    Do not wear jewelry, make-up or nail polish.  Do not wear lotions, powders, or perfumes. Please wear deodorant and brush your teeth.  Do not shave 48 hours prior to surgery.  Men may shave face and neck.  Do not bring valuables to the hospital.  [facility] is not responsible for any belongings or valuables.  Contacts, dentures or bridgework may not be worn into surgery.  Leave your suitcase in the car.  After surgery it may be brought to your room.  For patients admitted to the hospital, discharge time will be determined by your treatment team.  Patients discharged the day of surgery will not be allowed to drive home.   Name and phone number of your driver:   family Special instructions:   DO NOT smoke the morning of your procedure.  Please read over the following fact sheets that you were given. Anesthesia Post-op Instructions and Care and Recovery After Surgery

## 2019-07-30 NOTE — H&P (Signed)
Surgical History & Physical  Patient Name: Timothy Tran DOB: [birth year]  Surgery: Cataract extraction with intraocular lens implant phacoemulsification; Right Eye  Surgeon: Baruch Goldmann MD Surgery Date:  08/06/2019 Pre-Op Date:  07/29/2019  HPI: A 55 Yr. old male patient 1. 1. The patient is returning after cataract surgery. The left eye is affected. Status post cataract surgery on 07-23-2019: Since the last visit, the affected area feels improvement and is doing well. The patient's vision is improved. Patient is following medication instructions 3 in 1 drops TID until today it will go to BID. Patient very happy with results OS. Not able to see with OD now c/w OS. OD extremely blurred and hazy c/w OS bright and clear. This is negatively affecting the patient's quality of life. Would like to proceed with OD ASAP for OU to be working together. HPI Completed by Dr. Baruch Goldmann  Medical History: Cataracts Heart Problem High Blood Pressure LDL  Review of Systems Urinary issues-unknown dx Acid reflux *COVID 2... Negative Allergic/Immunologic Negative Cardiovascular Negative Constitutional Negative Ear, Nose, Mouth & Throat Negative Endocrine Negative Eyes Negative Gastrointestinal Negative Genitourinary Negative Hemotologic/Lymphatic Negative Integumentary Negative Musculoskeletal Negative Neurological Negative Psychiatry Negative Respiratory  Social   Former smoker   Medication Prednisolone-gatiflox-bromfenac,  Tamsulosin, Trazodone, Omeprazole, Atorvastatin, Losartan Potassium, Aspirin, Zinc, Magnesium, Vitamin D2, ESTER-C,   Sx/Procedures Phaco c IOL OS with Dextenza,  Heart Stents,   Drug Allergies   NKDA  History & Physical: Heent:  Cataract, Right eye NECK: supple without bruits LUNGS: lungs clear to auscultation CV: regular rate and rhythm Abdomen: soft and non-tender  Impression & Plan: Assessment: 1.  CATARACT EXTRACTION STATUS; Left Eye (Z98.42) 2.   COMBINED FORMS AGE RELATED CATARACT; Right Eye (H25.811)  Plan: 1.  Same day post-op exam. Doing well. All post-op precautions discussed and instructions reviewed. Written instructions given. Continue Gati-Brom-Pred 2x/day for 3 more weeks. 2.  Dilates poorly - shugacaine by protocol. Omidira. Flomax. Cataract accounts for the patient's decreased vision. This visual impairment is not correctable with a tolerable change in glasses or contact lenses. Cataract surgery with an implantation of a new lens should significantly improve the visual and functional status of the patient. Discussed all risks, benefits, alternatives, and potential complications. Discussed the procedures and recovery. Patient desires to have surgery. A-scan ordered and performed today for intra-ocular lens calculations. The surgery will be performed in order to improve vision for driving, reading, and for eye examinations. Recommend phacoemulsification with intra-ocular lens. Right Eye. Surgery required to correct imbalance of vision. Malyugin Ring. Dextenza

## 2019-08-02 DIAGNOSIS — H25811 Combined forms of age-related cataract, right eye: Secondary | ICD-10-CM | POA: Diagnosis not present

## 2019-08-03 ENCOUNTER — Encounter (HOSPITAL_COMMUNITY)
Admission: RE | Admit: 2019-08-03 | Discharge: 2019-08-03 | Disposition: A | Discharge status: Home / Self Care | Payer: Medicare Other | Source: Ambulatory Visit | Attending: Ophthalmology | Admitting: Ophthalmology

## 2019-08-03 ENCOUNTER — Encounter (HOSPITAL_COMMUNITY): Payer: Self-pay

## 2019-08-03 ENCOUNTER — Other Ambulatory Visit: Payer: Self-pay

## 2019-08-04 ENCOUNTER — Other Ambulatory Visit (HOSPITAL_COMMUNITY)
Admission: RE | Admit: 2019-08-04 | Discharge: 2019-08-04 | Disposition: A | Discharge status: Home / Self Care | Payer: Medicare Other | Source: Ambulatory Visit | Attending: Ophthalmology | Admitting: Ophthalmology

## 2019-08-04 DIAGNOSIS — Z20822 Contact with and (suspected) exposure to covid-19: Secondary | ICD-10-CM | POA: Insufficient documentation

## 2019-08-04 DIAGNOSIS — Z01812 Encounter for preprocedural laboratory examination: Secondary | ICD-10-CM | POA: Insufficient documentation

## 2019-08-05 LAB — SARS CORONAVIRUS 2 (TAT 6-24 HRS): SARS Coronavirus 2: NEGATIVE

## 2019-08-06 ENCOUNTER — Ambulatory Visit (HOSPITAL_COMMUNITY)
Admission: RE | Admit: 2019-08-06 | Discharge: 2019-08-06 | Disposition: A | Discharge status: Home / Self Care | Payer: Medicare Other | Attending: Ophthalmology | Admitting: Ophthalmology

## 2019-08-06 ENCOUNTER — Ambulatory Visit (HOSPITAL_COMMUNITY): Payer: Medicare Other | Admitting: Certified Registered"

## 2019-08-06 ENCOUNTER — Encounter (HOSPITAL_COMMUNITY): Payer: Self-pay | Admitting: Ophthalmology

## 2019-08-06 ENCOUNTER — Encounter (HOSPITAL_COMMUNITY)
Admission: RE | Disposition: A | Discharge status: Home / Self Care | Payer: Self-pay | Source: Home / Self Care | Attending: Ophthalmology

## 2019-08-06 DIAGNOSIS — H25811 Combined forms of age-related cataract, right eye: Secondary | ICD-10-CM | POA: Insufficient documentation

## 2019-08-06 DIAGNOSIS — Z955 Presence of coronary angioplasty implant and graft: Secondary | ICD-10-CM | POA: Insufficient documentation

## 2019-08-06 DIAGNOSIS — G4730 Sleep apnea, unspecified: Secondary | ICD-10-CM | POA: Diagnosis not present

## 2019-08-06 DIAGNOSIS — H2181 Floppy iris syndrome: Secondary | ICD-10-CM | POA: Diagnosis not present

## 2019-08-06 DIAGNOSIS — Z7982 Long term (current) use of aspirin: Secondary | ICD-10-CM | POA: Insufficient documentation

## 2019-08-06 DIAGNOSIS — Z79899 Other long term (current) drug therapy: Secondary | ICD-10-CM | POA: Insufficient documentation

## 2019-08-06 DIAGNOSIS — I2510 Atherosclerotic heart disease of native coronary artery without angina pectoris: Secondary | ICD-10-CM | POA: Diagnosis not present

## 2019-08-06 DIAGNOSIS — Z87891 Personal history of nicotine dependence: Secondary | ICD-10-CM | POA: Diagnosis not present

## 2019-08-06 DIAGNOSIS — E7800 Pure hypercholesterolemia, unspecified: Secondary | ICD-10-CM | POA: Insufficient documentation

## 2019-08-06 HISTORY — PX: CATARACT EXTRACTION W/PHACO: SHX586

## 2019-08-06 SURGERY — PHACOEMULSIFICATION, CATARACT, WITH IOL INSERTION
Anesthesia: Monitor Anesthesia Care | Site: Eye | Laterality: Right

## 2019-08-06 MED ORDER — TETRACAINE HCL 0.5 % OP SOLN
1.0000 [drp] | OPHTHALMIC | Status: DC | PRN
  Administered 2019-08-06: 1 [drp] via OPHTHALMIC

## 2019-08-06 MED ORDER — LIDOCAINE HCL 3.5 % OP GEL
1.0000 "application " | Freq: Once | OPHTHALMIC | Status: AC
  Administered 2019-08-06: 1 via OPHTHALMIC

## 2019-08-06 MED ORDER — SODIUM HYALURONATE 23 MG/ML IO SOLN
INTRAOCULAR | Status: DC | PRN
  Administered 2019-08-06: 0.6 mL via INTRAOCULAR

## 2019-08-06 MED ORDER — PHENYLEPHRINE-KETOROLAC 1-0.3 % IO SOLN
INTRAOCULAR | Status: AC
  Filled 2019-08-06: qty 4

## 2019-08-06 MED ORDER — EPINEPHRINE PF 1 MG/ML IJ SOLN
INTRAMUSCULAR | Status: AC
  Filled 2019-08-06: qty 2

## 2019-08-06 MED ORDER — PHENYLEPHRINE HCL 2.5 % OP SOLN
1.0000 [drp] | OPHTHALMIC | Status: AC | PRN
  Administered 2019-08-06 (×3): 1 [drp] via OPHTHALMIC

## 2019-08-06 MED ORDER — LIDOCAINE HCL (PF) 1 % IJ SOLN
INTRAOCULAR | Status: DC | PRN
  Administered 2019-08-06: 1 mL via OPHTHALMIC

## 2019-08-06 MED ORDER — POVIDONE-IODINE 5 % OP SOLN
OPHTHALMIC | Status: DC | PRN
  Administered 2019-08-06: 1 via OPHTHALMIC

## 2019-08-06 MED ORDER — PHENYLEPHRINE-KETOROLAC 1-0.3 % IO SOLN
INTRAOCULAR | Status: DC | PRN
  Administered 2019-08-06: 500 mL via OPHTHALMIC

## 2019-08-06 MED ORDER — DEXAMETHASONE 0.4 MG OP INST
VAGINAL_INSERT | OPHTHALMIC | Status: AC
  Filled 2019-08-06: qty 1

## 2019-08-06 MED ORDER — CYCLOPENTOLATE-PHENYLEPHRINE 0.2-1 % OP SOLN
1.0000 [drp] | OPHTHALMIC | Status: AC | PRN
  Administered 2019-08-06 (×3): 1 [drp] via OPHTHALMIC

## 2019-08-06 MED ORDER — BSS IO SOLN
INTRAOCULAR | Status: DC | PRN
  Administered 2019-08-06: 15 mL via INTRAOCULAR

## 2019-08-06 MED ORDER — PROVISC 10 MG/ML IO SOLN
INTRAOCULAR | Status: DC | PRN
  Administered 2019-08-06: 0.85 mL via INTRAOCULAR

## 2019-08-06 MED ORDER — DEXAMETHASONE 0.4 MG OP INST
VAGINAL_INSERT | OPHTHALMIC | Status: DC | PRN
  Administered 2019-08-06: 0.4 mg via OPHTHALMIC

## 2019-08-06 SURGICAL SUPPLY — 13 items
CLOTH BEACON ORANGE TIMEOUT ST (SAFETY) ×3 IMPLANT
EYE SHIELD UNIVERSAL CLEAR (GAUZE/BANDAGES/DRESSINGS) ×3 IMPLANT
GLOVE BIOGEL PI IND STRL 7.0 (GLOVE) ×2 IMPLANT
GLOVE BIOGEL PI INDICATOR 7.0 (GLOVE) ×4
LENS ALC ACRYL/TECN (Ophthalmic Related) ×3 IMPLANT
NEEDLE HYPO 18GX1.5 BLUNT FILL (NEEDLE) ×3 IMPLANT
PAD ARMBOARD 7.5X6 YLW CONV (MISCELLANEOUS) ×3 IMPLANT
RING MALYGIN 7.0 (MISCELLANEOUS) ×3 IMPLANT
SYR TB 1ML LL NO SAFETY (SYRINGE) ×3 IMPLANT
TAPE SURG TRANSPORE 1 IN (GAUZE/BANDAGES/DRESSINGS) ×1 IMPLANT
TAPE SURGICAL TRANSPORE 1 IN (GAUZE/BANDAGES/DRESSINGS) ×3
VISCOELASTIC ADDITIONAL (OPHTHALMIC RELATED) ×3 IMPLANT
WATER STERILE IRR 250ML POUR (IV SOLUTION) ×3 IMPLANT

## 2019-08-06 NOTE — Anesthesia Preprocedure Evaluation (Signed)
Anesthesia Evaluation  Patient identified by MRN, date of birth, ID band Patient awake    Reviewed: Allergy & Precautions, H&P , NPO status , Patient's Chart, lab work & pertinent test results, reviewed documented beta blocker date and time   Airway Mallampati: II  TM Distance: >3 FB Neck ROM: full    Dental no notable dental hx.    Pulmonary neg pulmonary ROS, sleep apnea and Continuous Positive Airway Pressure Ventilation , former smoker,    Pulmonary exam normal breath sounds clear to auscultation       Cardiovascular Exercise Tolerance: Good + CAD and + Cardiac Stents  negative cardio ROS Normal cardiovascular exam Rhythm:regular Rate:Normal     Neuro/Psych negative neurological ROS  negative psych ROS   GI/Hepatic negative GI ROS, Neg liver ROS,   Endo/Other  negative endocrine ROS  Renal/GU negative Renal ROS  negative genitourinary   Musculoskeletal negative musculoskeletal ROS (+)   Abdominal   Peds negative pediatric ROS (+)  Hematology negative hematology ROS (+)   Anesthesia Other Findings   Reproductive/Obstetrics negative OB ROS                             Anesthesia Physical Anesthesia Plan  ASA: II  Anesthesia Plan: MAC   Post-op Pain Management:    Induction:   PONV Risk Score and Plan: 2  Airway Management Planned:   Additional Equipment:   Intra-op Plan:   Post-operative Plan:   Informed Consent: I have reviewed the patients History and Physical, chart, labs and discussed the procedure including the risks, benefits and alternatives for the proposed anesthesia with the patient or authorized representative who has indicated his/her understanding and acceptance.     Dental Advisory Given  Plan Discussed with: CRNA  Anesthesia Plan Comments:         Anesthesia Quick Evaluation

## 2019-08-06 NOTE — Transfer of Care (Signed)
Immediate Anesthesia Transfer of Care Note  Patient: Timothy Tran  Procedure(s) Performed: CATARACT EXTRACTION PHACO AND INTRAOCULAR LENS PLACEMENT RIGHT EYE (Right Eye)  Patient Location: PACU and Short Stay  Anesthesia Type:MAC  Level of Consciousness: awake, alert  and oriented  Airway & Oxygen Therapy: Patient Spontanous Breathing  Post-op Assessment: Report given to RN and Post -op Vital signs reviewed and stable  Post vital signs: Reviewed and stable  Last Vitals:  Vitals Value Taken Time  BP    Temp    Pulse    Resp    SpO2      Last Pain: There were no vitals filed for this visit.       Complications: No apparent anesthesia complications

## 2019-08-06 NOTE — Anesthesia Postprocedure Evaluation (Signed)
Anesthesia Post Note  Patient: Timothy Tran  Procedure(s) Performed: CATARACT EXTRACTION PHACO AND INTRAOCULAR LENS PLACEMENT RIGHT EYE (Right Eye)  Patient location during evaluation: Phase II Anesthesia Type: MAC Level of consciousness: awake, oriented and awake and alert Pain management: pain level not controlled Vital Signs Assessment: post-procedure vital signs reviewed and stable Respiratory status: spontaneous breathing and nonlabored ventilation Cardiovascular status: stable and blood pressure returned to baseline Postop Assessment: no headache     Last Vitals: There were no vitals filed for this visit.  Last Pain: There were no vitals filed for this visit.               Tacy Learn

## 2019-08-06 NOTE — Discharge Instructions (Addendum)
Please discharge patient when stable, will follow up today with Dr. Merion Tran at the [facility] office immediately following discharge.  Leave shield in place until visit.  All paperwork with discharge instructions will be given at the office.  [facility] Address:  [address]  [place], [place] [address]   Cataract Surgery, Care After This sheet gives you information about how to care for yourself after your procedure. Your health care provider may also give you more specific instructions. If you have problems or questions, contact your health care provider. What can I expect after the procedure? After the procedure, it is common to have:  Itching.  Discomfort.  Fluid discharge.  Sensitivity to light and to touch.  Bruising in or around the eye.  Mild blurred vision. Follow these instructions at home: Eye care   Do not touch or rub your eyes.  Protect your eyes as told by your health care provider. You may be told to wear a protective eye shield or sunglasses.  Do not put a contact lens into the affected eye or eyes until your health care provider approves.  Keep the area around your eye clean and dry: ? Avoid swimming. ? Do not allow water to hit you directly in the face while showering. ? Keep soap and shampoo out of your eyes.  Check your eye every day for signs of infection. Watch for: ? Redness, swelling, or pain. ? Fluid, blood, or pus. ? Warmth. ? A bad smell. ? Vision that is getting worse. ? Sensitivity that is getting worse. Activity  Do not drive for 24 hours if you were given a sedative during your procedure.  Avoid strenuous activities, such as playing contact sports, for as long as told by your health care provider.  Do not drive or use heavy machinery until your health care provider approves.  Do not bend or lift heavy objects. Bending increases pressure in the eye. You can walk, climb stairs, and do light  household chores.  Ask your health care provider when you can return to work. If you work in a dusty environment, you may be advised to wear protective eyewear for a period of time. General instructions  Take or apply over-the-counter and prescription medicines only as told by your health care provider. This includes eye drops.  Keep all follow-up visits as told by your health care provider. This is important. Contact a health care provider if:  You have increased bruising around your eye.  You have pain that is not helped with medicine.  You have a fever.  You have redness, swelling, or pain in your eye.  You have fluid, blood, or pus coming from your incision.  Your vision gets worse.  Your sensitivity to light gets worse. Get help right away if:  You have sudden loss of vision.  You see flashes of light or spots (floaters).  You have severe eye pain.  You develop nausea or vomiting. Summary  After your procedure, it is common to have itching, discomfort, bruising, fluid discharge, or sensitivity to light.  Follow instructions from your health care provider about caring for your eye after the procedure.  Do not rub your eye after the procedure. You may need to wear eye protection or sunglasses. Do not wear contact lenses. Keep the area around your eye clean and dry.  Avoid activities that require a lot of effort. These include playing sports and lifting heavy objects.  Contact a health care   provider if you have increased bruising, pain that does not go away, or a fever. Get help right away if you suddenly lose your vision, see flashes of light or spots, or have severe pain in the eye. This information is not intended to replace advice given to you by your health care provider. Make sure you discuss any questions you have with your health care provider. Document Revised: 12/29/2018 Document Reviewed: 09/01/2017 Elsevier Patient Education  2020 [employer] Monitored  Anesthesia Care, Care After These instructions provide you with information about caring for yourself after your procedure. Your health care provider may also give you more specific instructions. Your treatment has been planned according to current medical practices, but problems sometimes occur. Call your health care provider if you have any problems or questions after your procedure. What can I expect after the procedure? After your procedure, you may:  Feel sleepy for several hours.  Feel clumsy and have poor balance for several hours.  Feel forgetful about what happened after the procedure.  Have poor judgment for several hours.  Feel nauseous or vomit.  Have a sore throat if you had a breathing tube during the procedure. Follow these instructions at home: For at least 24 hours after the procedure:      Have a responsible adult stay with you. It is important to have someone help care for you until you are awake and alert.  Rest as needed.  Do not: ? Participate in activities in which you could fall or become injured. ? Drive. ? Use heavy machinery. ? Drink alcohol. ? Take sleeping pills or medicines that cause drowsiness. ? Make important decisions or sign legal documents. ? Take care of children on your own. Eating and drinking  Follow the diet that is recommended by your health care provider.  If you vomit, drink water, juice, or soup when you can drink without vomiting.  Make sure you have little or no nausea before eating solid foods. General instructions  Take over-the-counter and prescription medicines only as told by your health care provider.  If you have sleep apnea, surgery and certain medicines can increase your risk for breathing problems. Follow instructions from your health care provider about wearing your sleep device: ? Anytime you are sleeping, including during daytime naps. ? While taking prescription pain medicines, sleeping medicines, or  medicines that make you drowsy.  If you smoke, do not smoke without supervision.  Keep all follow-up visits as told by your health care provider. This is important. Contact a health care provider if:  You keep feeling nauseous or you keep vomiting.  You feel light-headed.  You develop a rash.  You have a fever. Get help right away if:  You have trouble breathing. Summary  For several hours after your procedure, you may feel sleepy and have poor judgment.  Have a responsible adult stay with you for at least 24 hours or until you are awake and alert. This information is not intended to replace advice given to you by your health care provider. Make sure you discuss any questions you have with your health care provider. Document Revised: 06/02/2017 Document Reviewed: 06/25/2015 Elsevier Patient Education  2020 Elsevier Inc.  

## 2019-08-06 NOTE — Op Note (Signed)
Date of procedure: 08/06/19  Pre-operative diagnosis: Visually significant combined-form age-related cataract, Right Eye; Poor Dilation, Right Eye (H25.811; H21.81)  Post-operative diagnosis: Visually significant cataract, Right Eye; Intra-operative Floppy Iris Syndrome, Right Eye  Procedure: 1. Removal of cataract via phacoemulsification and insertion of intra-ocular lens [place]  +24.0D into the capsular bag of the Right Eye (CPT [phone]) 2. Placement of Dextenza insert, right eye  Attending surgeon: Gerda Diss. Sandee Bernath, MD, MA  Anesthesia: MAC, Topical Akten  Complications: None  Estimated Blood Loss: <4m (minimal)  Specimens: None  Implants: As above  Indications:  Visually significant cataract, Right Eye  Procedure:  The patient was seen and identified in the pre-operative area. The operative eye was identified and dilated.  The operative eye was marked.  Topical anesthesia was administered to the operative eye.     The patient was then to the operative suite and placed in the supine position.  A timeout was performed confirming the patient, procedure to be performed, and all other relevant information.   The patient's face was prepped and draped in the usual fashion for intra-ocular surgery.  A lid speculum was placed into the operative eye and the surgical microscope moved into place and focused.  Poor dilation of the iris was confirmed.  A superotemporal paracentesis was created using a 20 gauge paracentesis blade.  Shugarcaine was injected into the anterior chamber.  Viscoelastic was injected into the anterior chamber.  A temporal clear-corneal main wound incision was created using a 2.446mmicrokeratome.  A Malyugin ring was placed.  A continuous curvilinear capsulorrhexis was initiated using an irrigating cystitome and completed using capsulorrhexis forceps.  Hydrodissection and hydrodeliniation were performed.  Viscoelastic was injected into the anterior  chamber.  A phacoemulsification handpiece and a chopper as a second instrument were used to remove the nucleus and epinucleus. The irrigation/aspiration handpiece was used to remove any remaining cortical material.   The capsular bag was reinflated with viscoelastic, checked, and found to be intact.  The intraocular lens was inserted into the capsular bag and dialed into place using a Kuglen hook.  The Malyugin ring was removed.  The irrigation/aspiration handpiece was used to remove any remaining viscoelastic.  The clear corneal wound and paracentesis wounds were then hydrated and checked with Weck-Cels to be watertight.  The lid-speculum and drape was removed.  A Dextenza implant was placed in the canaliculus.  The patient's face was cleaned with a wet and dry 4x4.  A clear shield was taped over the eye. The patient was taken to the post-operative care unit in good condition, having tolerated the procedure well.  Post-Op Instructions: [facility] and will receive all other orders and instructions.

## 2019-08-06 NOTE — Interval H&P Note (Signed)
History and Physical Interval Note: The H and P was reviewed and updated. The patient was examined.  No changes were found after exam.  The surgical eye was marked.  08/06/2019 8:24 AM  Timothy Tran  has presented today for surgery, with the diagnosis of Nuclear sclerotic cataract - Right eye.  The various methods of treatment have been discussed with the patient and family. After consideration of risks, benefits and other options for treatment, the patient has consented to  Procedure(s) with comments: CATARACT EXTRACTION PHACO AND INTRAOCULAR LENS PLACEMENT RIGHT EYE (Right) - right as a surgical intervention.  The patient's history has been reviewed, patient examined, no change in status, stable for surgery.  I have reviewed the patient's chart and labs.  Questions were answered to the patient's satisfaction.     Baruch Goldmann

## 2019-11-16 DIAGNOSIS — L570 Actinic keratosis: Secondary | ICD-10-CM | POA: Diagnosis not present

## 2020-01-09 NOTE — Progress Notes (Signed)
Cardiology Office Note  Date: 01/10/2020   ID: CHRISTINE SCHIEFELBEIN, DOB June 11, 1940, MRN [id number]  PCP:  Celene Squibb, MD  Cardiologist:  No primary care provider on file. Electrophysiologist:  None   Chief Complaint: Follow-up CAD  History of Present Illness: Timothy Tran is a 79 y.o. male with a history of CAD, HLD, OSA.  Status post LAD and ramus branch stenting with BMS 2008.  Last encounter with Dr. Bronson Ing 12/28/2018.  He denied any chest pain, palpitations, shortness of breath, lightheadedness, dizziness, leg swelling, orthopnea, PND, syncope.  CAD was stable he was continuing aspirin and statin.  Blood pressure was markedly elevated.  He was asked to monitor his blood pressure 3 times per week for the next 4 weeks and inform Dr. Bronson Ing of these results.    Patient is here for follow-up.  He denies any recent anginal or exertional symptoms, palpitations or arrhythmias, orthostatic symptoms, stroke or TIA-like symptoms, PND, orthopnea, no bleeding issues.  States he mows his grass many times with a push mower which is approximately 2 acres without any symptoms at all.  Having no claudication-like symptoms, DVT or PE-like symptoms, or lower extremity edema.  Blood pressure 158/72 on arrival today.  States his blood pressure is usually in the 130s over 70s at home.  Recently had cataract surgery right eye in May.   Past Medical History:  Diagnosis Date  . CAD (coronary artery disease)   . Dyslipidemia   . OSA on CPAP     Past Surgical History:  Procedure Laterality Date  . CATARACT EXTRACTION W/PHACO Left 07/23/2019   Procedure: CATARACT EXTRACTION PHACO AND INTRAOCULAR LENS PLACEMENT (IOC);  Surgeon: Baruch Goldmann, MD;  Location: [facility];  Service: Ophthalmology;  Laterality: Left;  CDE: 17.80  . CATARACT EXTRACTION W/PHACO Right 08/06/2019   Procedure: CATARACT EXTRACTION PHACO AND INTRAOCULAR LENS PLACEMENT RIGHT EYE;  Surgeon: Baruch Goldmann, MD;  Location: [facility];   Service: Ophthalmology;  Laterality: Right;  CDE: 9.77  . CORONARY ANGIOPLASTY WITH STENT PLACEMENT  09/16/2006   stenting of the prox. LAD and ramus intermediate coronary artery  . NM MYOCAR PERF WALL MOTION  06/02/2008   mild, small perfusion defect in the basal inferior,mid inferior & apicl inferior regions. Abnormal study. Results unchanged from previous study.  Low risk scan.  Marland Kitchen US ECHOCARDIOGRAPHY  05/20/2008   mild MR,TR and trace PI.    Current Outpatient Medications  Medication Sig Dispense Refill  . ascorbic acid (VITAMIN C) 500 MG tablet Take 500 mg by mouth daily.    Marland Kitchen aspirin 81 MG tablet Take 81 mg by mouth daily.    Marland Kitchen atorvastatin (LIPITOR) 40 MG tablet Take 40 mg by mouth daily.     . Cholecalciferol (VITAMIN D-3 PO) Take 2,000 Units by mouth daily.     Marland Kitchen losartan (COZAAR) 50 MG tablet Take 1 tablet (50 mg total) by mouth daily. 90 tablet 3  . magnesium gluconate (MAGONATE) 500 MG tablet Take 500 mg by mouth daily.    Marland Kitchen omeprazole (PRILOSEC) 20 MG capsule Take 20 mg by mouth 2 (two) times daily before a meal.     . tamsulosin (FLOMAX) 0.4 MG CAPS capsule Take 0.4 mg by mouth daily.    . traZODone (DESYREL) 50 MG tablet Take 50 mg by mouth at bedtime.     Marland Kitchen zinc gluconate 50 MG tablet Take 50 mg by mouth daily.     No current facility-administered medications for this visit.  Allergies:  Patient has no known allergies.   Social History: The patient  reports that he quit smoking about 39 years ago. His smoking use included cigarettes. He has never used smokeless tobacco. He reports that he does not drink alcohol and does not use drugs.   Family History: The patient's family history includes Hyperlipidemia in his brother, mother, and sister.   ROS:  Please see the history of present illness. Otherwise, complete review of systems is positive for none.  All other systems are reviewed and negative.   Physical Exam: VS:  BP (!) 158/72   Pulse (!) 57   Ht 5\' 10"  (1.778 m)   Wt  195 lb 12.8 oz (88.8 kg)   SpO2 95%   BMI 28.09 kg/m , BMI Body mass index is 28.09 kg/m.  Wt Readings from Last 3 Encounters:  01/10/20 195 lb 12.8 oz (88.8 kg)  12/28/18 190 lb 6.4 oz (86.4 kg)  07/23/18 200 lb (90.7 kg)    General: Patient appears comfortable at rest. Neck: Supple, no elevated JVP or carotid bruits, no thyromegaly. Lungs: Clear to auscultation, nonlabored breathing at rest. Cardiac: Regular rate and rhythm, no S3 or significant systolic murmur, no pericardial rub. Extremities: No pitting edema, distal pulses 2+. Skin: Warm and dry. Musculoskeletal: No kyphosis. Neuropsychiatric: Alert and oriented x3, affect grossly appropriate.  ECG:  An ECG dated 01/10/2020 was personally reviewed today and demonstrated:  Sinus bradycardia rate of 58  Recent Labwork: No results found for requested labs within last 8760 hours.     Component Value Date/Time   CHOL 138 12/23/2014 0814   TRIG 67 12/23/2014 0814   HDL 48 12/23/2014 0814   CHOLHDL 2.9 12/23/2014 0814   VLDL 13 12/23/2014 0814   LDLCALC 77 12/23/2014 0814    Other Studies Reviewed Today:   Assessment and Plan:  1. Essential hypertension   2. Mixed hyperlipidemia    1. Essential hypertension Blood pressure  elevated at 158/72 today.  States his blood pressures at home are usually in the 130s over 70s.  Continue losartan 50 mg p.o. daily  2. Mixed hyperlipidemia Continue atorvastatin 40 mg daily.Lipid panel from PCP 2019: TC 156, TG 137, HDL 47, LDL 82.  Patient states he is due to see his primary care provider soon.  He will have PCP send [place] lab work results.   Medication Adjustments/Labs and Tests Ordered: Current medicines are reviewed at length with the patient today.  Concerns regarding medicines are outlined above.   Disposition: Follow-up with Dr. Domenic Polite or APP 1 year Signed, Levell July, NP 01/10/2020 4:11 PM    [facility] [place], [place],  [place] [address] Phone: [phone]; Fax: [phone]

## 2020-01-10 ENCOUNTER — Ambulatory Visit (INDEPENDENT_AMBULATORY_CARE_PROVIDER_SITE_OTHER): Payer: Medicare Other | Admitting: Family Medicine

## 2020-01-10 ENCOUNTER — Encounter: Payer: Self-pay | Admitting: Family Medicine

## 2020-01-10 VITALS — BP 158/72 | HR 57 | Ht 70.0 in | Wt 195.8 lb

## 2020-01-10 DIAGNOSIS — E782 Mixed hyperlipidemia: Secondary | ICD-10-CM | POA: Diagnosis not present

## 2020-01-10 DIAGNOSIS — I10 Essential (primary) hypertension: Secondary | ICD-10-CM | POA: Diagnosis not present

## 2020-01-10 NOTE — Patient Instructions (Addendum)
Medication Instructions:  Continue all current medications.   Labwork: none  Testing/Procedures: none  Follow-Up: 6 months   Any Other Special Instructions Will Be Listed Below (If Applicable).   If you need a refill on your cardiac medications before your next appointment, please call your pharmacy.  

## 2020-01-21 ENCOUNTER — Other Ambulatory Visit: Payer: Self-pay | Admitting: *Deleted

## 2020-01-21 MED ORDER — LOSARTAN POTASSIUM 50 MG PO TABS: 50.0000 mg | ORAL_TABLET | Freq: Every day | ORAL | 3 refills | Status: DC

## 2020-02-03 DIAGNOSIS — Z23 Encounter for immunization: Secondary | ICD-10-CM | POA: Diagnosis not present

## 2020-02-14 DIAGNOSIS — Z23 Encounter for immunization: Secondary | ICD-10-CM | POA: Diagnosis not present

## 2020-02-14 DIAGNOSIS — R7301 Impaired fasting glucose: Secondary | ICD-10-CM | POA: Diagnosis not present

## 2020-02-14 DIAGNOSIS — I25810 Atherosclerosis of coronary artery bypass graft(s) without angina pectoris: Secondary | ICD-10-CM | POA: Diagnosis not present

## 2020-02-14 DIAGNOSIS — E785 Hyperlipidemia, unspecified: Secondary | ICD-10-CM | POA: Diagnosis not present

## 2020-02-14 DIAGNOSIS — R300 Dysuria: Secondary | ICD-10-CM | POA: Diagnosis not present

## 2020-02-14 DIAGNOSIS — E7849 Other hyperlipidemia: Secondary | ICD-10-CM | POA: Diagnosis not present

## 2020-02-14 DIAGNOSIS — Z6828 Body mass index (BMI) 28.0-28.9, adult: Secondary | ICD-10-CM | POA: Diagnosis not present

## 2020-02-14 DIAGNOSIS — N400 Enlarged prostate without lower urinary tract symptoms: Secondary | ICD-10-CM | POA: Diagnosis not present

## 2020-02-14 DIAGNOSIS — E782 Mixed hyperlipidemia: Secondary | ICD-10-CM | POA: Diagnosis not present

## 2020-02-14 DIAGNOSIS — G4700 Insomnia, unspecified: Secondary | ICD-10-CM | POA: Diagnosis not present

## 2020-02-14 DIAGNOSIS — I10 Essential (primary) hypertension: Secondary | ICD-10-CM | POA: Diagnosis not present

## 2020-02-14 DIAGNOSIS — K219 Gastro-esophageal reflux disease without esophagitis: Secondary | ICD-10-CM | POA: Diagnosis not present

## 2020-02-14 DIAGNOSIS — R350 Frequency of micturition: Secondary | ICD-10-CM | POA: Diagnosis not present

## 2020-02-17 DIAGNOSIS — K219 Gastro-esophageal reflux disease without esophagitis: Secondary | ICD-10-CM | POA: Diagnosis not present

## 2020-02-17 DIAGNOSIS — N400 Enlarged prostate without lower urinary tract symptoms: Secondary | ICD-10-CM | POA: Diagnosis not present

## 2020-02-17 DIAGNOSIS — R7301 Impaired fasting glucose: Secondary | ICD-10-CM | POA: Diagnosis not present

## 2020-02-17 DIAGNOSIS — E782 Mixed hyperlipidemia: Secondary | ICD-10-CM | POA: Diagnosis not present

## 2020-02-17 DIAGNOSIS — Z0001 Encounter for general adult medical examination with abnormal findings: Secondary | ICD-10-CM | POA: Diagnosis not present

## 2020-02-17 DIAGNOSIS — G4700 Insomnia, unspecified: Secondary | ICD-10-CM | POA: Diagnosis not present

## 2020-06-14 DIAGNOSIS — I10 Essential (primary) hypertension: Secondary | ICD-10-CM | POA: Diagnosis not present

## 2020-06-14 DIAGNOSIS — E1165 Type 2 diabetes mellitus with hyperglycemia: Secondary | ICD-10-CM | POA: Diagnosis not present

## 2020-07-07 ENCOUNTER — Ambulatory Visit: Payer: Medicare Other | Admitting: Family Medicine

## 2020-08-07 NOTE — Progress Notes (Signed)
Cardiology Office Note  Date: 08/08/2020   ID: Timothy Tran, DOB [birth year], MRN [id number]  PCP:  Celene Squibb, MD  Cardiologist:  None Electrophysiologist:  None   Chief Complaint: Follow-up CAD  History of Present Illness: Timothy Tran is a 80 y.o. male with a history of CAD, HLD, OSA.  Status post LAD and ramus branch stenting with BMS 2008.  Last encounter with Dr. Bronson Ing 12/28/2018.  He denied any chest pain, palpitations, shortness of breath, lightheadedness, dizziness, leg swelling, orthopnea, PND, syncope.  CAD was stable he was continuing aspirin and statin.  Blood pressure was markedly elevated.  He was asked to monitor his blood pressure 3 times per week for the next 4 weeks and inform Dr. Bronson Ing of these results.    He is here today for 60-month follow-up.  His blood pressure is elevated today at 188/90.  He states he has not been checking his blood pressures at home.  His previous blood pressure in October of last year was 158/72.  His primary complaint today is right knee pain.  He states he can lay down on the couch and when he gets up he has significant pain in the knee and lower leg on the right side.  Also complaining of insomnia.  States he can usually go to sleep and usually wakes up and cannot go back to sleep unless he gets up and drinks a cup of coffee in watches TV for a while.  States he is having some significant issues with this over the last few months.  He is currently taking trazodone 50 mg for sleep but states it is not working.  I advised him to speak with Dr. Nevada Crane for a possible alternative to help with sleep.  He denies any anginal or exertional symptoms, orthostatic symptoms, CVA or TIA-like symptoms, DOE or SOB.  Denies any PND or orthopnea.     Past Medical History:  Diagnosis Date  . CAD (coronary artery disease)   . Dyslipidemia   . OSA on CPAP     Past Surgical History:  Procedure Laterality Date  . CATARACT EXTRACTION W/PHACO Left  07/23/2019   Procedure: CATARACT EXTRACTION PHACO AND INTRAOCULAR LENS PLACEMENT (IOC);  Surgeon: Baruch Goldmann, MD;  Location: [facility];  Service: Ophthalmology;  Laterality: Left;  CDE: 17.80  . CATARACT EXTRACTION W/PHACO Right 08/06/2019   Procedure: CATARACT EXTRACTION PHACO AND INTRAOCULAR LENS PLACEMENT RIGHT EYE;  Surgeon: Baruch Goldmann, MD;  Location: [facility];  Service: Ophthalmology;  Laterality: Right;  CDE: 9.77  . CORONARY ANGIOPLASTY WITH STENT PLACEMENT  09/16/2006   stenting of the prox. LAD and ramus intermediate coronary artery  . NM MYOCAR PERF WALL MOTION  06/02/2008   mild, small perfusion defect in the basal inferior,mid inferior & apicl inferior regions. Abnormal study. Results unchanged from previous study.  Low risk scan.  Marland Kitchen US ECHOCARDIOGRAPHY  05/20/2008   mild MR,TR and trace PI.    Current Outpatient Medications  Medication Sig Dispense Refill  . ascorbic acid (VITAMIN C) 500 MG tablet Take 500 mg by mouth daily.    Marland Kitchen aspirin 81 MG tablet Take 81 mg by mouth daily.    Marland Kitchen atorvastatin (LIPITOR) 40 MG tablet Take 40 mg by mouth daily.     . Cholecalciferol (VITAMIN D-3 PO) Take 2,000 Units by mouth daily.     Marland Kitchen gabapentin (NEURONTIN) 100 MG capsule Take 100 mg by mouth daily.    . magnesium gluconate (MAGONATE) 500  MG tablet Take 500 mg by mouth daily.    . nitroGLYCERIN (NITROSTAT) 0.4 MG SL tablet Place 1 tablet under the tongue every 5 (five) minutes x 3 doses as needed for severe pain. If no relief after 2 nd dose, proceed to ED for an evaluation or call 911    . omeprazole (PRILOSEC) 20 MG capsule Take 20 mg by mouth 2 (two) times daily before a meal.     . tamsulosin (FLOMAX) 0.4 MG CAPS capsule Take 0.4 mg by mouth daily.    . traZODone (DESYREL) 50 MG tablet Take 50 mg by mouth at bedtime.     Marland Kitchen zinc gluconate 50 MG tablet Take 50 mg by mouth daily.    Marland Kitchen losartan (COZAAR) 50 MG tablet Take 1 tablet (50 mg total) by mouth 2 (two) times daily. 180 tablet 2   No  current facility-administered medications for this visit.   Allergies:  Patient has no known allergies.   Social History: The patient  reports that he quit smoking about 40 years ago. His smoking use included cigarettes. He has never used smokeless tobacco. He reports that he does not drink alcohol and does not use drugs.   Family History: The patient's family history includes Hyperlipidemia in his brother, mother, and sister.   ROS:  Please see the history of present illness. Otherwise, complete review of systems is positive for none.  All other systems are reviewed and negative.   Physical Exam: VS:  BP (!) 188/90   Pulse (!) 52   Ht 5\' 10"  (1.778 m)   Wt 195 lb 12.8 oz (88.8 kg)   SpO2 95%   BMI 28.09 kg/m , BMI Body mass index is 28.09 kg/m.  Wt Readings from Last 3 Encounters:  08/08/20 195 lb 12.8 oz (88.8 kg)  01/10/20 195 lb 12.8 oz (88.8 kg)  12/28/18 190 lb 6.4 oz (86.4 kg)    General: Patient appears comfortable at rest. Neck: Supple, no elevated JVP or carotid bruits, no thyromegaly. Lungs: Clear to auscultation, nonlabored breathing at rest. Cardiac: Regular rate and rhythm, no S3 or significant systolic murmur, no pericardial rub. Extremities: No pitting edema, distal pulses 2+. Skin: Warm and dry. Musculoskeletal: No kyphosis. Neuropsychiatric: Alert and oriented x3, affect grossly appropriate.  ECG:  An ECG dated 01/10/2020 was personally reviewed today and demonstrated:  Sinus bradycardia rate of 58  Recent Labwork: No results found for requested labs within last 8760 hours.     Component Value Date/Time   CHOL 138 12/23/2014 0814   TRIG 67 12/23/2014 0814   HDL 48 12/23/2014 0814   CHOLHDL 2.9 12/23/2014 0814   VLDL 13 12/23/2014 0814   LDLCALC 77 12/23/2014 0814    Other Studies Reviewed Today:   Assessment and Plan:  1. Essential hypertension   2. Mixed hyperlipidemia   3. Acute pain of right knee    1. Essential hypertension Blood  pressure  elevated at 188/90.  Recheck in left arm 160/90.Marland Kitchen  He states he has not been checking his blood pressures regularly at home recently.  Increase losartan to 50 mg p.o. twice daily.  Start checking blood pressures 3-4 times per week and call in 2 weeks with blood pressure results.    2. Mixed hyperlipidemia Continue atorvastatin 40 mg daily.Lipid panel from PCP 2019: TC 156, TG 137, HDL 47, LDL 82.  Patient states he is due to see his primary care provider soon.   3.  Right knee pain Patient is  complaining of right knee pain especially when lying flat on the couch and attempting to get up from a sitting position.  He states he usually has significant pain initially.  He states he can usually mow about 2 acres of grass without any significant knee pain.  Please refer to either Dr. Aline Brochure or Dr. Luna Glasgow for evaluation  Medication Adjustments/Labs and Tests Ordered: Current medicines are reviewed at length with the patient today.  Concerns regarding medicines are outlined above.   Disposition: Follow-up with Dr. Domenic Polite or APP 3 months Signed, Levell July, NP 08/08/2020 11:41 AM    [facility] [place], [place], [place] [address] Phone: [phone]; Fax: [phone]

## 2020-08-08 ENCOUNTER — Encounter: Payer: Self-pay | Admitting: Family Medicine

## 2020-08-08 ENCOUNTER — Ambulatory Visit (INDEPENDENT_AMBULATORY_CARE_PROVIDER_SITE_OTHER): Payer: Medicare Other | Admitting: Family Medicine

## 2020-08-08 VITALS — BP 188/90 | HR 52 | Ht 70.0 in | Wt 195.8 lb

## 2020-08-08 DIAGNOSIS — E782 Mixed hyperlipidemia: Secondary | ICD-10-CM

## 2020-08-08 DIAGNOSIS — M25561 Pain in right knee: Secondary | ICD-10-CM

## 2020-08-08 DIAGNOSIS — I10 Essential (primary) hypertension: Secondary | ICD-10-CM

## 2020-08-08 MED ORDER — LOSARTAN POTASSIUM 50 MG PO TABS: 50.0000 mg | ORAL_TABLET | Freq: Two times a day (BID) | ORAL | 2 refills | Status: DC

## 2020-08-08 NOTE — Patient Instructions (Addendum)
Medication Instructions:    Your physician has recommended you make the following change in your medication:   Increase losartan to 50 mg by mouth twice daily  Continue other medications the same  Labwork:  none  Testing/Procedures:  none  Follow-Up:  Your physician recommends that you schedule a follow-up appointment in: 3 months.  Any Other Special Instructions Will Be Listed Below (If Applicable).  You have been referred to an Orthopedic doctor. Your physician has requested that you regularly monitor and record your blood pressure readings at home daily. Please use the same machine at the same time of day to check your readings and record them. Call office in 2 weeks with your blood pressure readings.  If you need a refill on your cardiac medications before your next appointment, please call your pharmacy.

## 2020-08-15 ENCOUNTER — Ambulatory Visit: Payer: Medicare Other | Admitting: Orthopedic Surgery

## 2020-08-23 DIAGNOSIS — G609 Hereditary and idiopathic neuropathy, unspecified: Secondary | ICD-10-CM | POA: Insufficient documentation

## 2020-08-23 DIAGNOSIS — E119 Type 2 diabetes mellitus without complications: Secondary | ICD-10-CM | POA: Insufficient documentation

## 2020-08-23 DIAGNOSIS — K219 Gastro-esophageal reflux disease without esophagitis: Secondary | ICD-10-CM | POA: Insufficient documentation

## 2020-08-23 DIAGNOSIS — G4700 Insomnia, unspecified: Secondary | ICD-10-CM | POA: Insufficient documentation

## 2020-08-28 DIAGNOSIS — I10 Essential (primary) hypertension: Secondary | ICD-10-CM | POA: Diagnosis not present

## 2020-08-28 DIAGNOSIS — E1165 Type 2 diabetes mellitus with hyperglycemia: Secondary | ICD-10-CM | POA: Diagnosis not present

## 2020-08-30 DIAGNOSIS — R7301 Impaired fasting glucose: Secondary | ICD-10-CM | POA: Diagnosis not present

## 2020-08-30 DIAGNOSIS — E782 Mixed hyperlipidemia: Secondary | ICD-10-CM | POA: Diagnosis not present

## 2020-08-30 DIAGNOSIS — D696 Thrombocytopenia, unspecified: Secondary | ICD-10-CM | POA: Insufficient documentation

## 2020-08-30 DIAGNOSIS — N401 Enlarged prostate with lower urinary tract symptoms: Secondary | ICD-10-CM | POA: Diagnosis not present

## 2020-08-30 DIAGNOSIS — I10 Essential (primary) hypertension: Secondary | ICD-10-CM | POA: Diagnosis not present

## 2020-08-30 DIAGNOSIS — R42 Dizziness and giddiness: Secondary | ICD-10-CM | POA: Diagnosis not present

## 2020-08-30 DIAGNOSIS — G4700 Insomnia, unspecified: Secondary | ICD-10-CM | POA: Diagnosis not present

## 2020-08-30 DIAGNOSIS — K219 Gastro-esophageal reflux disease without esophagitis: Secondary | ICD-10-CM | POA: Diagnosis not present

## 2020-09-01 DIAGNOSIS — Z23 Encounter for immunization: Secondary | ICD-10-CM | POA: Diagnosis not present

## 2020-09-14 DIAGNOSIS — I10 Essential (primary) hypertension: Secondary | ICD-10-CM | POA: Diagnosis not present

## 2020-09-14 DIAGNOSIS — E1165 Type 2 diabetes mellitus with hyperglycemia: Secondary | ICD-10-CM | POA: Diagnosis not present

## 2020-11-09 ENCOUNTER — Ambulatory Visit (INDEPENDENT_AMBULATORY_CARE_PROVIDER_SITE_OTHER): Payer: Medicare Other | Admitting: Student

## 2020-11-09 ENCOUNTER — Encounter: Payer: Self-pay | Admitting: Student

## 2020-11-09 ENCOUNTER — Ambulatory Visit: Payer: Medicare Other | Admitting: Family Medicine

## 2020-11-09 ENCOUNTER — Other Ambulatory Visit: Payer: Self-pay

## 2020-11-09 VITALS — BP 156/64 | HR 52 | Ht 70.0 in | Wt 194.0 lb

## 2020-11-09 DIAGNOSIS — I2510 Atherosclerotic heart disease of native coronary artery without angina pectoris: Secondary | ICD-10-CM

## 2020-11-09 DIAGNOSIS — I10 Essential (primary) hypertension: Secondary | ICD-10-CM | POA: Diagnosis not present

## 2020-11-09 DIAGNOSIS — E785 Hyperlipidemia, unspecified: Secondary | ICD-10-CM | POA: Diagnosis not present

## 2020-11-09 MED ORDER — AMLODIPINE BESYLATE 2.5 MG PO TABS: 2.5000 mg | ORAL_TABLET | Freq: Every day | ORAL | 3 refills | Status: DC

## 2020-11-09 NOTE — Progress Notes (Addendum)
Cardiology Office Note    Date:  11/09/2020   ID:  Timothy Tran February 20, 1941, MRN [id number]  PCP:  Celene Squibb, MD  Cardiologist: Previously followed by Dr. Bronson Ing --> Patient requests to follow-up with Dr. Domenic Polite as he is the Primary Cardiologist for his wife as well.   Chief Complaint  Patient presents with   Follow-up    3 month visit    History of Present Illness:    Timothy Tran is a 80 y.o. male with past medical history of CAD (s/p stenting of LAD and RI in 2008, low-risk NST in 05/2008), HTN, HLD and OSA who presents to the office today for 36-monthfollow-up.  He was recently examined by AKatina Dung NP in 07/2020 and denied any recent anginal symptoms at that time. He did report worsening insomnia along with right knee pain as well.  Blood pressure was elevated at 188/90 and rechecked and improved to 160/90. Losartan was increased to 50 mg twice daily and he was continued on ASA and statin therapy.  In talking with the patient and his wife today, he reports doing well since his last office visit. He does walk for a mile in the morning hours and denies any recent chest pain or dyspnea on exertion with this. Reports he did not really have anginal symptoms prior to his stenting in 2008. He denies any recent orthopnea, PND or pitting edema.  Blood pressure has been elevated when checked at home and was at 156/64 on initial check today, rechecked and at 148/78. He reports good compliance with his current medication regimen. Does consume a high sodium diet and says he has sausage or bacon most mornings.   Past Medical History:  Diagnosis Date   CAD (coronary artery disease)    a. s/p stenting of LAD and RI in 2008 b. low-risk NST in 05/2008   Dyslipidemia    OSA on CPAP     Past Surgical History:  Procedure Laterality Date   CATARACT EXTRACTION W/PHACO Left 07/23/2019   Procedure: CATARACT EXTRACTION PHACO AND INTRAOCULAR LENS PLACEMENT ([place];  Surgeon: WBaruch Goldmann MD;  Location: [facility];  Service: Ophthalmology;  Laterality: Left;  CDE: 17.80   CATARACT EXTRACTION W/PHACO Right 08/06/2019   Procedure: CATARACT EXTRACTION PHACO AND INTRAOCULAR LENS PLACEMENT RIGHT EYE;  Surgeon: WBaruch Goldmann MD;  Location: [facility];  Service: Ophthalmology;  Laterality: Right;  CDE: 9.77   CORONARY ANGIOPLASTY WITH STENT PLACEMENT  09/16/2006   stenting of the prox. LAD and ramus intermediate coronary artery   NM MYOCAR PERF WALL MOTION  06/02/2008   mild, small perfusion defect in the basal inferior,mid inferior & apicl inferior regions. Abnormal study. Results unchanged from previous study.  Low risk scan.   [place]  05/20/2008   mild MR,TR and trace PI.    Current Medications: Outpatient Medications Prior to Visit  Medication Sig Dispense Refill   ascorbic acid (VITAMIN C) 500 MG tablet Take 500 mg by mouth daily.     aspirin 81 MG tablet Take 81 mg by mouth daily.     atorvastatin (LIPITOR) 40 MG tablet Take 40 mg by mouth daily.      Cholecalciferol (VITAMIN D-3 PO) Take 2,000 Units by mouth daily.      losartan (COZAAR) 50 MG tablet Take 1 tablet (50 mg total) by mouth 2 (two) times daily. 180 tablet 2   magnesium gluconate (MAGONATE) 500 MG tablet Take 500 mg by mouth daily.  nitroGLYCERIN (NITROSTAT) 0.4 MG SL tablet Place 1 tablet under the tongue every 5 (five) minutes x 3 doses as needed for severe pain. If no relief after 2 nd dose, proceed to ED for an evaluation or call 911     omeprazole (PRILOSEC) 20 MG capsule Take 20 mg by mouth 2 (two) times daily before a meal.      tamsulosin (FLOMAX) 0.4 MG CAPS capsule Take 0.4 mg by mouth daily.     traZODone (DESYREL) 50 MG tablet Take 50 mg by mouth at bedtime.      zinc gluconate 50 MG tablet Take 50 mg by mouth daily.     gabapentin (NEURONTIN) 100 MG capsule Take 100 mg by mouth daily.     No facility-administered medications prior to visit.     Allergies:   Patient has no known  allergies.   Social History   Socioeconomic History   Marital status: Married    Spouse name: Not on file   Number of children: Not on file   Years of education: Not on file   Highest education level: Not on file  Occupational History   Not on file  Tobacco Use   Smoking status: Former    Types: Cigarettes    Quit date: 03/17/1980    Years since quitting: 40.6   Smokeless tobacco: Never  Vaping Use   Vaping Use: Never used  Substance and Sexual Activity   Alcohol use: No   Drug use: No   Sexual activity: Not on file  Other Topics Concern   Not on file  Social History Narrative   Not on file   Social Determinants of Health   Financial Resource Strain: Not on file  Food Insecurity: Not on file  Transportation Needs: Not on file  Physical Activity: Not on file  Stress: Not on file  Social Connections: Not on file     Family History:  The patient's family history includes Hyperlipidemia in his brother, mother, and sister.   Review of Systems:    Please see the history of present illness.     All other systems reviewed and are otherwise negative except as noted above.   Physical Exam:    VS:  BP (!) 156/64   Pulse (!) 52   Ht '5\' 10"'$  (1.778 m)   Wt 194 lb (88 kg)   SpO2 96%   BMI 27.84 kg/m    General: Well developed, well nourished,male appearing in no acute distress. Head: Normocephalic, atraumatic. Neck: No carotid bruits. JVD not elevated.  Lungs: Respirations regular and unlabored, without wheezes or rales.  Heart: Regular rhythm, bradycardiac rate. No S3 or S4.  No murmur, no rubs, or gallops appreciated. Abdomen: Appears non-distended. No obvious abdominal masses. Msk:  Strength and tone appear normal for age. No obvious joint deformities or effusions. Extremities: No clubbing or cyanosis. No pitting edema.  Distal pedal pulses are 2+ bilaterally. Neuro: Alert and oriented X 3. Moves all extremities spontaneously. No focal deficits noted. Psych:   Responds to questions appropriately with a normal affect. Skin: No rashes or lesions noted  Wt Readings from Last 3 Encounters:  11/09/20 194 lb (88 kg)  08/08/20 195 lb 12.8 oz (88.8 kg)  01/10/20 195 lb 12.8 oz (88.8 kg)     Studies/Labs Reviewed:   EKG:  EKG is not ordered today.   Recent Labs: No results found for requested labs within last 8760 hours.   Lipid Panel    Component Value  Date/Time   CHOL 138 12/23/2014 0814   TRIG 67 12/23/2014 0814   HDL 48 12/23/2014 0814   CHOLHDL 2.9 12/23/2014 0814   VLDL 13 12/23/2014 0814   LDLCALC 77 12/23/2014 0814    Additional studies/ records that were reviewed today include:   Echocardiogram: 2010   Assessment:    1. Coronary artery disease involving native coronary artery of native heart without angina pectoris   2. Essential hypertension   3. Hyperlipidemia LDL goal <70      Plan:   In order of problems listed above:  1. CAD - He is s/p stenting of LAD and RI in 2008 with low-risk NST in 05/2008. He remains active at baseline and denies any recent anginal symptoms but reports he did not have any symptoms prior to stenting in 2008. Given that it has been 12 years since his last ischemic evaluation, will plan to obtain a Lexiscan Myoview. - Continue ASA 81 mg daily, Atorvastatin 40 mg daily and PRN SL NTG.   2. HTN - His BP was at 136/64 on initial check, rechecked and improved to 148/78. He has been on Losartan 50 mg twice daily. Will plan to add Amlodipine 2.5 mg daily. I did encourage him to keep a BP log at home and report back if readings remain elevated as he may require further dose adjustment of Amlodipine. Would avoid AV nodal blocking agents given his baseline heart rate in the 50's. We also reviewed the importance of limiting his sodium intake.   3. HLD - Followed by his PCP. Will request a copy of most recent labs. He remains on Atorvastatin 40 mg daily with goal LDL less than 70 in the setting of known  CAD.   Medication Adjustments/Labs and Tests Ordered: Current medicines are reviewed at length with the patient today.  Concerns regarding medicines are outlined above.  Medication changes, Labs and Tests ordered today are listed in the Patient Instructions below. Patient Instructions  Medication Instructions:   START Amlodipine 2.5 mg daily   *If you need a refill on your cardiac medications before your next appointment, please call your pharmacy*   Lab Work: None today  If you have labs (blood work) drawn today and your tests are completely normal, you will receive your results only by: [place] (if you have MyChart) OR A paper copy in the mail If you have any lab test that is abnormal or we need to change your treatment, we will call you to review the results.   Testing/Procedures: Your physician has requested that you have a lexiscan myoview. For further information please visit [email or web address]. Please follow instruction sheet, as given.    Follow-Up: At [facility], you and your health needs are our priority.  As part of our continuing mission to provide you with exceptional heart care, we have created designated Provider Care Teams.  These Care Teams include your primary Cardiologist (physician) and Advanced Practice Providers (APPs -  Physician Assistants and Nurse Practitioners) who all work together to provide you with the care you need, when you need it.  We recommend signing up for the patient portal called "MyChart".  Sign up information is provided on this After Visit Summary.  MyChart is used to connect with patients for Virtual Visits (Telemedicine).  Patients are able to view lab/test results, encounter notes, upcoming appointments, etc.  Non-urgent messages can be sent to your provider as well.   To learn more about what you can do with MyChart,  go to [email or web address].    Your next appointment:   6 month(s)  The format for your next  appointment:   In Person  Provider:   Rozann Lesches, MD or Bernerd Pho, PA-C   Other Instructions None today    Signed, Erma Heritage, PA-C  11/09/2020 2:29 PM    [facility] [address] [place], [place] [address] Phone: [phone] Fax: [phone]

## 2020-11-09 NOTE — Patient Instructions (Signed)
Medication Instructions:   START Amlodipine 2.5 mg daily   *If you need a refill on your cardiac medications before your next appointment, please call your pharmacy*   Lab Work: None today  If you have labs (blood work) drawn today and your tests are completely normal, you will receive your results only by: [place] (if you have MyChart) OR A paper copy in the mail If you have any lab test that is abnormal or we need to change your treatment, we will call you to review the results.   Testing/Procedures: Your physician has requested that you have a lexiscan myoview. For further information please visit [email or web address]. Please follow instruction sheet, as given.    Follow-Up: At [facility], you and your health needs are our priority.  As part of our continuing mission to provide you with exceptional heart care, we have created designated Provider Care Teams.  These Care Teams include your primary Cardiologist (physician) and Advanced Practice Providers (APPs -  Physician Assistants and Nurse Practitioners) who all work together to provide you with the care you need, when you need it.  We recommend signing up for the patient portal called "MyChart".  Sign up information is provided on this After Visit Summary.  MyChart is used to connect with patients for Virtual Visits (Telemedicine).  Patients are able to view lab/test results, encounter notes, upcoming appointments, etc.  Non-urgent messages can be sent to your provider as well.   To learn more about what you can do with MyChart, go to [email or web address].    Your next appointment:   6 month(s)  The format for your next appointment:   In Person  Provider:   Rozann Lesches, MD or Bernerd Pho, PA-C   Other Instructions None today

## 2020-11-14 ENCOUNTER — Other Ambulatory Visit: Payer: Self-pay

## 2020-11-14 ENCOUNTER — Encounter (HOSPITAL_COMMUNITY)
Admission: RE | Admit: 2020-11-14 | Discharge: 2020-11-14 | Disposition: A | Discharge status: Home / Self Care | Payer: Medicare Other | Source: Ambulatory Visit | Attending: Family Medicine | Admitting: Family Medicine

## 2020-11-14 ENCOUNTER — Ambulatory Visit (HOSPITAL_COMMUNITY)
Admission: RE | Admit: 2020-11-14 | Discharge: 2020-11-14 | Disposition: A | Discharge status: Home / Self Care | Payer: Medicare Other | Source: Ambulatory Visit | Attending: Cardiovascular Disease | Admitting: Cardiovascular Disease

## 2020-11-14 DIAGNOSIS — I2510 Atherosclerotic heart disease of native coronary artery without angina pectoris: Secondary | ICD-10-CM | POA: Diagnosis not present

## 2020-11-14 LAB — NM MYOCAR MULTI W/SPECT W/WALL MOTION / EF
LV dias vol: 116 mL (ref 62–150)
LV sys vol: 55 mL
Nuc Stress EF: 53 %
Peak HR: 88 {beats}/min
RATE: 0.3
Rest HR: 48 {beats}/min
Rest Nuclear Isotope Dose: 9.3 mCi
SDS: 0
SRS: 6
SSS: 6
ST Depression (mm): 0 mm
Stress Nuclear Isotope Dose: 30 mCi
TID: 1.05

## 2020-11-14 MED ORDER — REGADENOSON 0.4 MG/5ML IV SOLN
INTRAVENOUS | Status: AC
  Administered 2020-11-14: 0.4 mg via INTRAVENOUS
  Filled 2020-11-14: qty 5

## 2020-11-14 MED ORDER — TECHNETIUM TC 99M TETROFOSMIN IV KIT
30.0000 | PACK | Freq: Once | INTRAVENOUS | Status: AC | PRN
  Administered 2020-11-14: 30 via INTRAVENOUS

## 2020-11-14 MED ORDER — SODIUM CHLORIDE FLUSH 0.9 % IV SOLN
INTRAVENOUS | Status: AC
  Administered 2020-11-14: 10 mL via INTRAVENOUS
  Filled 2020-11-14: qty 10

## 2020-11-14 MED ORDER — TECHNETIUM TC 99M TETROFOSMIN IV KIT
10.0000 | PACK | Freq: Once | INTRAVENOUS | Status: AC | PRN
  Administered 2020-11-14: 9.25 via INTRAVENOUS

## 2020-11-15 DIAGNOSIS — L570 Actinic keratosis: Secondary | ICD-10-CM | POA: Diagnosis not present

## 2020-11-15 DIAGNOSIS — I10 Essential (primary) hypertension: Secondary | ICD-10-CM | POA: Diagnosis not present

## 2020-11-15 DIAGNOSIS — E782 Mixed hyperlipidemia: Secondary | ICD-10-CM | POA: Diagnosis not present

## 2020-11-23 DIAGNOSIS — U071 COVID-19: Secondary | ICD-10-CM | POA: Diagnosis not present

## 2021-01-10 DIAGNOSIS — Z23 Encounter for immunization: Secondary | ICD-10-CM | POA: Diagnosis not present

## 2021-01-25 DIAGNOSIS — Z20822 Contact with and (suspected) exposure to covid-19: Secondary | ICD-10-CM | POA: Diagnosis not present

## 2021-01-30 ENCOUNTER — Other Ambulatory Visit: Payer: Self-pay | Admitting: Family Medicine

## 2021-02-27 DIAGNOSIS — E119 Type 2 diabetes mellitus without complications: Secondary | ICD-10-CM | POA: Diagnosis not present

## 2021-03-01 DIAGNOSIS — R32 Unspecified urinary incontinence: Secondary | ICD-10-CM | POA: Insufficient documentation

## 2021-03-01 DIAGNOSIS — N401 Enlarged prostate with lower urinary tract symptoms: Secondary | ICD-10-CM | POA: Diagnosis not present

## 2021-03-01 DIAGNOSIS — L299 Pruritus, unspecified: Secondary | ICD-10-CM | POA: Insufficient documentation

## 2021-03-01 DIAGNOSIS — Z0001 Encounter for general adult medical examination with abnormal findings: Secondary | ICD-10-CM | POA: Diagnosis not present

## 2021-03-01 DIAGNOSIS — D696 Thrombocytopenia, unspecified: Secondary | ICD-10-CM | POA: Diagnosis not present

## 2021-03-01 DIAGNOSIS — I10 Essential (primary) hypertension: Secondary | ICD-10-CM | POA: Diagnosis not present

## 2021-03-01 DIAGNOSIS — G4700 Insomnia, unspecified: Secondary | ICD-10-CM | POA: Diagnosis not present

## 2021-03-01 DIAGNOSIS — R7301 Impaired fasting glucose: Secondary | ICD-10-CM | POA: Diagnosis not present

## 2021-03-01 DIAGNOSIS — K219 Gastro-esophageal reflux disease without esophagitis: Secondary | ICD-10-CM | POA: Diagnosis not present

## 2021-03-01 DIAGNOSIS — E782 Mixed hyperlipidemia: Secondary | ICD-10-CM | POA: Diagnosis not present

## 2021-05-07 ENCOUNTER — Encounter (HOSPITAL_COMMUNITY): Payer: Self-pay | Admitting: *Deleted

## 2021-05-07 ENCOUNTER — Emergency Department (HOSPITAL_COMMUNITY)
Admission: EM | Admit: 2021-05-07 | Discharge: 2021-05-07 | Disposition: A | Discharge status: Home / Self Care | Payer: Medicare Other | Attending: Emergency Medicine | Admitting: Emergency Medicine

## 2021-05-07 DIAGNOSIS — I10 Essential (primary) hypertension: Secondary | ICD-10-CM | POA: Diagnosis not present

## 2021-05-07 DIAGNOSIS — R39198 Other difficulties with micturition: Secondary | ICD-10-CM | POA: Diagnosis not present

## 2021-05-07 DIAGNOSIS — N390 Urinary tract infection, site not specified: Secondary | ICD-10-CM | POA: Diagnosis not present

## 2021-05-07 DIAGNOSIS — R339 Retention of urine, unspecified: Secondary | ICD-10-CM | POA: Diagnosis not present

## 2021-05-07 DIAGNOSIS — R399 Unspecified symptoms and signs involving the genitourinary system: Secondary | ICD-10-CM | POA: Diagnosis not present

## 2021-05-07 DIAGNOSIS — R52 Pain, unspecified: Secondary | ICD-10-CM | POA: Diagnosis not present

## 2021-05-07 LAB — CBC WITH DIFFERENTIAL/PLATELET
Abs Immature Granulocytes: 0.14 10*3/uL — ABNORMAL HIGH (ref 0.00–0.07)
Basophils Absolute: 0.1 10*3/uL (ref 0.0–0.1)
Basophils Relative: 1 %
Eosinophils Absolute: 0 10*3/uL (ref 0.0–0.5)
Eosinophils Relative: 1 %
HCT: 48.2 % (ref 39.0–52.0)
Hemoglobin: 16.1 g/dL (ref 13.0–17.0)
Immature Granulocytes: 2 %
Lymphocytes Relative: 12 %
Lymphs Abs: 0.9 10*3/uL (ref 0.7–4.0)
MCH: 29.2 pg (ref 26.0–34.0)
MCHC: 33.4 g/dL (ref 30.0–36.0)
MCV: 87.3 fL (ref 80.0–100.0)
Monocytes Absolute: 0.6 10*3/uL (ref 0.1–1.0)
Monocytes Relative: 9 %
Neutro Abs: 5.6 10*3/uL (ref 1.7–7.7)
Neutrophils Relative %: 75 %
Platelets: 159 10*3/uL (ref 150–400)
RBC: 5.52 MIL/uL (ref 4.22–5.81)
RDW: 12.7 % (ref 11.5–15.5)
WBC: 7.3 10*3/uL (ref 4.0–10.5)
nRBC: 0 % (ref 0.0–0.2)

## 2021-05-07 LAB — BASIC METABOLIC PANEL
Anion gap: 11 (ref 5–15)
BUN: 19 mg/dL (ref 8–23)
CO2: 22 mmol/L (ref 22–32)
Calcium: 9.2 mg/dL (ref 8.9–10.3)
Chloride: 101 mmol/L (ref 98–111)
Creatinine, Ser: 1.14 mg/dL (ref 0.61–1.24)
GFR, Estimated: >60 mL/min (ref >60)
Glucose, Bld: 114 mg/dL — ABNORMAL HIGH (ref 70–99)
Potassium: 3.5 mmol/L (ref 3.5–5.1)
Sodium: 134 mmol/L — ABNORMAL LOW (ref 135–145)

## 2021-05-07 LAB — URINALYSIS, ROUTINE W REFLEX MICROSCOPIC
Bilirubin Urine: NEGATIVE
Glucose, UA: NEGATIVE mg/dL
Ketones, ur: NEGATIVE mg/dL
Leukocytes,Ua: NEGATIVE
Nitrite: POSITIVE — AB
Protein, ur: 30 mg/dL — AB
Specific Gravity, Urine: 1.005 (ref 1.005–1.030)
pH: 5 (ref 5.0–8.0)

## 2021-05-07 MED ORDER — CEPHALEXIN 500 MG PO CAPS
500.0000 mg | ORAL_CAPSULE | Freq: Four times a day (QID) | ORAL | 0 refills | Status: AC
Start: 2021-05-07 — End: 2021-05-14

## 2021-05-07 NOTE — Discharge Instructions (Signed)
Please pick up antibiotics and take as prescribed  Follow up with Dr. Alyson Ingles urology for further evaluation/removal of the urinary catheter  Return to the ED for any new/worsening symptoms

## 2021-05-07 NOTE — ED Triage Notes (Signed)
Unable to void, history of prostate issues

## 2021-05-07 NOTE — ED Provider Notes (Signed)
[facility] EMERGENCY DEPARTMENT Provider Note   CSN: [id number] Arrival date & time: 05/07/21  1719     History  Chief Complaint  Patient presents with   Urinary Retention    Timothy Tran is a 81 y.o. male who presents to the ED today with complaint of acute urinary retention that began earlier today. Pt reports he has only been able to void a very small amount since earlier this morning. He has never had issues with same however does report he has an enlarged prostate and takes flomax daily. He currently complains of 10/10 pain to his bladder. No other complaints currently.   The history is provided by the patient and the spouse.      Home Medications Prior to Admission medications   Medication Sig Start Date End Date Taking? Authorizing Provider  amLODipine (NORVASC) 2.5 MG tablet Take 1 tablet (2.5 mg total) by mouth daily. 11/09/20  Yes Strader, [place] M, PA-C  ascorbic acid (VITAMIN C) 500 MG tablet Take 500 mg by mouth daily.   Yes [provider]  atorvastatin (LIPITOR) 40 MG tablet Take 40 mg by mouth daily.  03/26/16  Yes [provider]  cephALEXin (KEFLEX) 500 MG capsule Take 1 capsule (500 mg total) by mouth 4 (four) times daily for 7 days. 05/07/21 05/14/21 Yes Lelon Ikard, PA-C  Cholecalciferol (VITAMIN D-3 PO) Take 2,000 Units by mouth daily.    Yes [provider]  losartan (COZAAR) 50 MG tablet Take 1 tablet (50 mg total) by mouth 2 (two) times daily. 01/30/21  Yes Strader, [place] M, PA-C  nitroGLYCERIN (NITROSTAT) 0.4 MG SL tablet Place 1 tablet under the tongue every 5 (five) minutes x 3 doses as needed for severe pain. If no relief after 2 nd dose, proceed to ED for an evaluation or call 911 05/29/20  Yes [provider]  omeprazole (PRILOSEC) 20 MG capsule Take 20 mg by mouth 2 (two) times daily before a meal.    Yes [provider]  tamsulosin (FLOMAX) 0.4 MG CAPS capsule Take 0.4 mg by mouth 2 (two) times daily.    Yes [provider]  traZODone (DESYREL) 50 MG tablet Take 50 mg by mouth at bedtime.  08/25/18  Yes [provider]  zinc gluconate 50 MG tablet Take 50 mg by mouth daily.   Yes [provider]      Allergies    Patient has no known allergies.    Review of Systems   Review of Systems  Constitutional:  Negative for chills and fever.  Gastrointestinal:  Negative for abdominal pain, nausea and vomiting.  Genitourinary:  Positive for decreased urine volume and difficulty urinating.  All other systems reviewed and are negative.  Physical Exam Updated Vital Signs BP 122/69    Pulse 72    Temp (!) 97.3 F (36.3 C) (Oral)    Resp 18    SpO2 96%  Physical Exam Vitals and nursing note reviewed.  Constitutional:      Appearance: He is not ill-appearing or diaphoretic.  HENT:     Head: Normocephalic and atraumatic.  Eyes:     Conjunctiva/sclera: Conjunctivae normal.  Cardiovascular:     Rate and Rhythm: Normal rate and regular rhythm.  Pulmonary:     Effort: Pulmonary effort is normal.     Breath sounds: Normal breath sounds.  Abdominal:     Palpations: Abdomen is soft.     Tenderness: There is no abdominal tenderness.  Musculoskeletal:  Cervical back: Neck supple.  Skin:    General: Skin is warm and dry.  Neurological:     Mental Status: He is alert.    ED Results / Procedures / Treatments   Labs (all labs ordered are listed, but only abnormal results are displayed) Labs Reviewed  CBC WITH DIFFERENTIAL/PLATELET - Abnormal; Notable for the following components:      Result Value   Abs Immature Granulocytes 0.14 (*)    All other components within normal limits  BASIC METABOLIC PANEL - Abnormal; Notable for the following components:   Sodium 134 (*)    Glucose, Bld 114 (*)    All other components within normal limits  URINALYSIS, ROUTINE W REFLEX MICROSCOPIC - Abnormal; Notable for the following components:   Hgb urine dipstick MODERATE (*)     Protein, ur 30 (*)    Nitrite POSITIVE (*)    Bacteria, UA RARE (*)    All other components within normal limits  URINE CULTURE    EKG None  Radiology No results found.  Procedures Procedures    Medications Ordered in ED Medications - No data to display  ED Course/ Medical Decision Making/ A&P                           Medical Decision Making 81 year old male who presents to the ED today with complaint of urinary retention.  Has only been able to void very small amount since earlier today.  Currently complaining of suprapubic abdominal pain.  Arrival to the ED patient's blood pressure significantly elevated at 179/122.  Remainder vitals unremarkable.  We will plan for bladder scan, Foley catheter, labs including CBC and CMP as well as urinalysis to assess for cause of urinary retention at this time.   Bladder scanner with > 1000 Ccs. Foley catheter placed with significant improvement in pain.   Workup overall reassuring without signs of AKI. Pt with nitrite in urine. Will treat for UTI at this time. Pt instructed to follow up with urology in the outpatient setting for acute urinary retention/UTI. He is in agreement with plan and stable for discharge home.   Problems Addressed: Lower urinary tract infectious disease: acute illness or injury Urinary retention: acute illness or injury  Amount and/or Complexity of Data Reviewed Labs: ordered.    Details: CBC without leukocytosis. Hgb stable at 16.1.  BMP with stable creatinine 1.14. Sodium 134. No other electrolyte abnormalities.  U/A with moderate hgb, nitrite positive, and rare bacteria. No leuks. Will send for culture. Will treat for infection. Pt does not have an IV in place. Will disharge with oral keflex and urology follow up.          Final Clinical Impression(s) / ED Diagnoses Final diagnoses:  Urinary retention  Lower urinary tract infectious disease    Rx / DC Orders ED Discharge Orders          Ordered     cephALEXin (KEFLEX) 500 MG capsule  4 times daily        05/07/21 2217             Discharge Instructions      Please pick up antibiotics and take as prescribed  Follow up with Dr. Alyson Ingles urology for further evaluation/removal of the urinary catheter  Return to the ED for any new/worsening symptoms       Eustaquio Maize, Hershal Coria 05/07/21 2218    Milton Ferguson, MD 05/08/21 1752

## 2021-05-07 NOTE — ED Notes (Signed)
Pt was bladder scanned, monitor showed 1059mls

## 2021-05-09 LAB — URINE CULTURE: Culture: NO GROWTH

## 2021-05-15 ENCOUNTER — Other Ambulatory Visit: Payer: Self-pay

## 2021-05-15 ENCOUNTER — Ambulatory Visit (INDEPENDENT_AMBULATORY_CARE_PROVIDER_SITE_OTHER): Payer: Medicare Other | Admitting: Urology

## 2021-05-15 VITALS — BP 167/84 | HR 90

## 2021-05-15 DIAGNOSIS — N138 Other obstructive and reflux uropathy: Secondary | ICD-10-CM | POA: Diagnosis not present

## 2021-05-15 DIAGNOSIS — N401 Enlarged prostate with lower urinary tract symptoms: Secondary | ICD-10-CM | POA: Diagnosis not present

## 2021-05-15 DIAGNOSIS — E782 Mixed hyperlipidemia: Secondary | ICD-10-CM | POA: Diagnosis not present

## 2021-05-15 DIAGNOSIS — R339 Retention of urine, unspecified: Secondary | ICD-10-CM | POA: Diagnosis not present

## 2021-05-15 DIAGNOSIS — I10 Essential (primary) hypertension: Secondary | ICD-10-CM | POA: Diagnosis not present

## 2021-05-15 MED ORDER — SILODOSIN 8 MG PO CAPS: 8.0000 mg | ORAL_CAPSULE | Freq: Every day | ORAL | 11 refills | Status: DC

## 2021-05-15 NOTE — Progress Notes (Signed)
Fill and Pull Catheter Removal  Patient is present today for a catheter removal.  Patient was cleaned and prepped in a sterile fashion 338ml of sterile water/ saline was instilled into the bladder when the patient felt the urge to urinate. 83ml of water was then drained from the balloon.  A 16FR foley cath was removed from the bladder no complications were noted .  Patient as then given some time to void on their own.  Patient can void  248ml on their own after some time.  Patient tolerated well.  Performed by: Estill Bamberg RN  Follow up/ Additional notes: patient to see MD

## 2021-05-15 NOTE — Progress Notes (Signed)
05/15/2021 9:14 AM   Timothy Tran 06/13/1940 [id number]  Referring provider: Celene Squibb, MD [address] [place],   [address]  Urinary retention   HPI: Timothy Tran is a 81yo here for evaluation of BPH and urinary retention. Last week he presented to the ER with an inability to urinate and had a foley placed. He had run out of his flomax for 3 days. He has been on flomax BID for 3 years. IPSS 22 QOL 5 prior to foley placement. He wishes to have the foley removed today. He has been on his flomax BID for 8 days.    PMH: Past Medical History:  Diagnosis Date   CAD (coronary artery disease)    a. s/p stenting of LAD and RI in 2008 b. low-risk NST in 05/2008   Dyslipidemia    OSA on CPAP     Surgical History: Past Surgical History:  Procedure Laterality Date   CATARACT EXTRACTION W/PHACO Left 07/23/2019   Procedure: CATARACT EXTRACTION PHACO AND INTRAOCULAR LENS PLACEMENT ([place]);  Surgeon: Baruch Goldmann, MD;  Location: [facility];  Service: Ophthalmology;  Laterality: Left;  CDE: 17.80   CATARACT EXTRACTION W/PHACO Right 08/06/2019   Procedure: CATARACT EXTRACTION PHACO AND INTRAOCULAR LENS PLACEMENT RIGHT EYE;  Surgeon: Baruch Goldmann, MD;  Location: [facility];  Service: Ophthalmology;  Laterality: Right;  CDE: 9.77   CORONARY ANGIOPLASTY WITH STENT PLACEMENT  09/16/2006   stenting of the prox. LAD and ramus intermediate coronary artery   NM MYOCAR PERF WALL MOTION  06/02/2008   mild, small perfusion defect in the basal inferior,mid inferior & apicl inferior regions. Abnormal study. Results unchanged from previous study.  Low risk scan.   US ECHOCARDIOGRAPHY  05/20/2008   mild Timothy,TR and trace PI.    Home Medications:  Allergies as of 05/15/2021       Reactions   Gabapentin         Medication List        Accurate as of May 15, 2021  9:14 AM. If you have any questions, ask your nurse or doctor.          amLODipine 2.5 MG tablet Commonly known as:  NORVASC Take 1 tablet (2.5 mg total) by mouth daily.   ascorbic acid 500 MG tablet Commonly known as: VITAMIN C Take 500 mg by mouth daily.   atorvastatin 40 MG tablet Commonly known as: LIPITOR Take 40 mg by mouth daily.   losartan 50 MG tablet Commonly known as: COZAAR Take 1 tablet (50 mg total) by mouth 2 (two) times daily.   nitroGLYCERIN 0.4 MG SL tablet Commonly known as: NITROSTAT Place 1 tablet under the tongue every 5 (five) minutes x 3 doses as needed for severe pain. If no relief after 2 nd dose, proceed to ED for an evaluation or call 911   omeprazole 20 MG capsule Commonly known as: PRILOSEC Take 20 mg by mouth 2 (two) times daily before a meal.   tamsulosin 0.4 MG Caps capsule Commonly known as: FLOMAX Take 0.4 mg by mouth 2 (two) times daily.   traZODone 50 MG tablet Commonly known as: DESYREL Take 50 mg by mouth at bedtime.   VITAMIN D-3 PO Take 2,000 Units by mouth daily.   zinc gluconate 50 MG tablet Take 50 mg by mouth daily.        Allergies:  Allergies  Allergen Reactions   Gabapentin     Family History: Family History  Problem Relation Age  of Onset   Hyperlipidemia Mother    Hyperlipidemia Sister    Hyperlipidemia Brother     Social History:  reports that he quit smoking about 41 years ago. His smoking use included cigarettes. He has never used smokeless tobacco. He reports that he does not drink alcohol and does not use drugs.  ROS: All other review of systems were reviewed and are negative except what is noted above in HPI  Physical Exam: BP (!) 167/84    Pulse 90   Constitutional:  Alert and oriented, No acute distress. HEENT: [place] AT, moist mucus membranes.  Trachea midline, no masses. Cardiovascular: No clubbing, cyanosis, or edema. Respiratory: Normal respiratory effort, no increased work of breathing. GI: Abdomen is soft, nontender, nondistended, no abdominal masses GU: No CVA tenderness.  Lymph: No cervical or inguinal  lymphadenopathy. Skin: No rashes, bruises or suspicious lesions. Neurologic: Grossly intact, no focal deficits, moving all 4 extremities. Psychiatric: Normal mood and affect.  Laboratory Data: Lab Results  Component Value Date   WBC 7.3 05/07/2021   HGB 16.1 05/07/2021   HCT 48.2 05/07/2021   MCV 87.3 05/07/2021   PLT 159 05/07/2021    Lab Results  Component Value Date   CREATININE 1.14 05/07/2021    No results found for: PSA  No results found for: TESTOSTERONE  No results found for: HGBA1C  Urinalysis    Component Value Date/Time   COLORURINE YELLOW 05/07/2021 2005   APPEARANCEUR CLEAR 05/07/2021 2005   LABSPEC 1.005 05/07/2021 2005   PHURINE 5.0 05/07/2021 2005   GLUCOSEU NEGATIVE 05/07/2021 2005   HGBUR MODERATE (A) 05/07/2021 2005   [place] NEGATIVE 05/07/2021 2005   [place] NEGATIVE 05/07/2021 2005   PROTEINUR 30 (A) 05/07/2021 2005   NITRITE POSITIVE (A) 05/07/2021 2005   LEUKOCYTESUR NEGATIVE 05/07/2021 2005    Lab Results  Component Value Date   BACTERIA RARE (A) 05/07/2021    Pertinent Imaging:  No results found for this or any previous visit.  No results found for this or any previous visit.  No results found for this or any previous visit.  No results found for this or any previous visit.  No results found for this or any previous visit.  No results found for this or any previous visit.  No results found for this or any previous visit.  No results found for this or any previous visit.   Assessment & Plan:    1. Urinary retention -Start rapaflo 8mg  daily and finasteride 5mg  daily  2. Benign prostatic hyperplasia with urinary obstruction -Start rapaflo 8mg  daily and finasteride 5mg  daily   No follow-ups on file.  Nicolette Bang, MD  [facility] [place]

## 2021-05-18 ENCOUNTER — Other Ambulatory Visit: Payer: Self-pay

## 2021-05-18 ENCOUNTER — Ambulatory Visit (INDEPENDENT_AMBULATORY_CARE_PROVIDER_SITE_OTHER): Payer: Medicare Other | Admitting: Urology

## 2021-05-18 ENCOUNTER — Encounter: Payer: Self-pay | Admitting: Urology

## 2021-05-18 VITALS — BP 176/91 | HR 70 | Ht 70.0 in | Wt 196.0 lb

## 2021-05-18 DIAGNOSIS — R339 Retention of urine, unspecified: Secondary | ICD-10-CM

## 2021-05-18 LAB — URINALYSIS, ROUTINE W REFLEX MICROSCOPIC
Bilirubin, UA: NEGATIVE
Glucose, UA: NEGATIVE
Ketones, UA: NEGATIVE
Leukocytes,UA: NEGATIVE
Nitrite, UA: NEGATIVE
Protein,UA: NEGATIVE
RBC, UA: NEGATIVE
Specific Gravity, UA: 1.02 (ref 1.005–1.030)
Urobilinogen, Ur: 0.2 mg/dL (ref 0.2–1.0)
pH, UA: 6 (ref 5.0–7.5)

## 2021-05-18 LAB — BLADDER SCAN AMB NON-IMAGING: Scan Result: 407

## 2021-05-18 MED ORDER — FINASTERIDE 5 MG PO TABS: 5.0000 mg | ORAL_TABLET | Freq: Every day | ORAL | 3 refills | Status: DC

## 2021-05-18 NOTE — Progress Notes (Signed)
post void residual =426mL ?

## 2021-05-18 NOTE — Progress Notes (Signed)
? ?05/18/2021 ?12:39 PM  ? ?Timothy Tran ?11/28/1940 ?[id number] ? ?Referring provider: Celene Squibb, MD ?[address] ?[name] ?[place],  [place] [address] ? ?Followup urinary retention ? ? ?HPI: ?Mr Moorman is a 81yo here for followup for BPh with urinary retention. PVR 407. He has not started rapaflo and finasteride yet. He is on flomax 0.4mg  BID. Urine stream improved. He denies straining to urinate. Nocturia 2-3x. No other complaints today ? ? ?PMH: ?Past Medical History:  ?Diagnosis Date  ? CAD (coronary artery disease)   ? a. s/p stenting of LAD and RI in 2008 b. low-risk NST in 05/2008  ? Dyslipidemia   ? OSA on CPAP   ? ? ?Surgical History: ?Past Surgical History:  ?Procedure Laterality Date  ? CATARACT EXTRACTION W/PHACO Left 07/23/2019  ? Procedure: CATARACT EXTRACTION PHACO AND INTRAOCULAR LENS PLACEMENT ([facility]);  Surgeon: Baruch Goldmann, MD;  Location: [facility];  Service: Ophthalmology;  Laterality: Left;  CDE: 17.80  ? CATARACT EXTRACTION W/PHACO Right 08/06/2019  ? Procedure: CATARACT EXTRACTION PHACO AND INTRAOCULAR LENS PLACEMENT RIGHT EYE;  Surgeon: Baruch Goldmann, MD;  Location: [facility];  Service: Ophthalmology;  Laterality: Right;  CDE: 9.77  ? CORONARY ANGIOPLASTY WITH STENT PLACEMENT  09/16/2006  ? stenting of the prox. LAD and ramus intermediate coronary artery  ? NM MYOCAR PERF WALL MOTION  06/02/2008  ? mild, small perfusion defect in the basal inferior,mid inferior & apicl inferior regions. Abnormal study. Results unchanged from previous study.  Low risk scan.  ? US ECHOCARDIOGRAPHY  05/20/2008  ? mild MR,TR and trace PI.  ? ? ?Home Medications:  ?Allergies as of 05/18/2021   ? ?   Reactions  ? Gabapentin   ? ?  ? ?  ?Medication List  ?  ? ?  ? Accurate as of May 18, 2021 12:39 PM. If you have any questions, ask your nurse or doctor.  ?  ?  ? ?  ? ?amLODipine 2.5 MG tablet ?Commonly known as: NORVASC ?Take 1 tablet (2.5 mg total) by mouth daily. ?  ?ascorbic acid 500 MG tablet ?Commonly known as: VITAMIN  C ?Take 500 mg by mouth daily. ?  ?atorvastatin 40 MG tablet ?Commonly known as: LIPITOR ?Take 40 mg by mouth daily. ?  ?finasteride 5 MG tablet ?Commonly known as: PROSCAR ?Take 1 tablet (5 mg total) by mouth daily. ?  ?losartan 50 MG tablet ?Commonly known as: COZAAR ?Take 1 tablet (50 mg total) by mouth 2 (two) times daily. ?  ?nitroGLYCERIN 0.4 MG SL tablet ?Commonly known as: NITROSTAT ?Place 1 tablet under the tongue every 5 (five) minutes x 3 doses as needed for severe pain. If no relief after 2 nd dose, proceed to ED for an evaluation or call 911 ?  ?omeprazole 20 MG capsule ?Commonly known as: PRILOSEC ?Take 20 mg by mouth 2 (two) times daily before a meal. ?  ?silodosin 8 MG Caps capsule ?Commonly known as: RAPAFLO ?Take 1 capsule (8 mg total) by mouth at bedtime. ?  ?tamsulosin 0.4 MG Caps capsule ?Commonly known as: FLOMAX ?Take 0.4 mg by mouth 2 (two) times daily. ?  ?traZODone 50 MG tablet ?Commonly known as: DESYREL ?Take 50 mg by mouth at bedtime. ?  ?VITAMIN D-3 PO ?Take 2,000 Units by mouth daily. ?  ?zinc gluconate 50 MG tablet ?Take 50 mg by mouth daily. ?  ? ?  ? ? ?Allergies:  ?Allergies  ?Allergen Reactions  ? Gabapentin   ? ? ?Family  History: ?Family History  ?Problem Relation Age of Onset  ? Hyperlipidemia Mother   ? Hyperlipidemia Sister   ? Hyperlipidemia Brother   ? ? ?Social History:  reports that he quit smoking about 41 years ago. His smoking use included cigarettes. He has never used smokeless tobacco. He reports that he does not drink alcohol and does not use drugs. ? ?ROS: ?All other review of systems were reviewed and are negative except what is noted above in HPI ? ?Physical Exam: ?BP (!) 176/91   Pulse 70   Ht 5\' 10"  (1.778 m)   Wt 196 lb (88.9 kg)   BMI 28.12 kg/m?   ?Constitutional:  Alert and oriented, No acute distress. ?HEENT: [place] AT, moist mucus membranes.  Trachea midline, no masses. ?Cardiovascular: No clubbing, cyanosis, or edema. ?Respiratory: Normal respiratory  effort, no increased work of breathing. ?GI: Abdomen is soft, nontender, nondistended, no abdominal masses ?GU: No CVA tenderness.  ?Lymph: No cervical or inguinal lymphadenopathy. ?Skin: No rashes, bruises or suspicious lesions. ?Neurologic: Grossly intact, no focal deficits, moving all 4 extremities. ?Psychiatric: Normal mood and affect. ? ?Laboratory Data: ?Lab Results  ?Component Value Date  ? WBC 7.3 05/07/2021  ? HGB 16.1 05/07/2021  ? HCT 48.2 05/07/2021  ? MCV 87.3 05/07/2021  ? PLT 159 05/07/2021  ? ? ?Lab Results  ?Component Value Date  ? CREATININE 1.14 05/07/2021  ? ? ?No results found for: PSA ? ?No results found for: TESTOSTERONE ? ?No results found for: HGBA1C ? ?Urinalysis ?   ?Component Value Date/Time  ? [place] YELLOW 05/07/2021 2005  ? APPEARANCEUR CLEAR 05/07/2021 2005  ? LABSPEC 1.005 05/07/2021 2005  ? PHURINE 5.0 05/07/2021 2005  ? [place] NEGATIVE 05/07/2021 2005  ? HGBUR MODERATE (A) 05/07/2021 2005  ? [place] NEGATIVE 05/07/2021 2005  ? [place] NEGATIVE 05/07/2021 2005  ? PROTEINUR 30 (A) 05/07/2021 2005  ? NITRITE POSITIVE (A) 05/07/2021 2005  ? LEUKOCYTESUR NEGATIVE 05/07/2021 2005  ? ? ?Lab Results  ?Component Value Date  ? BACTERIA RARE (A) 05/07/2021  ? ? ?Pertinent Imaging: ? ?No results found for this or any previous visit. ? ?No results found for this or any previous visit. ? ?No results found for this or any previous visit. ? ?No results found for this or any previous visit. ? ?No results found for this or any previous visit. ? ?No results found for this or any previous visit. ? ?No results found for this or any previous visit. ? ?No results found for this or any previous visit. ? ? ?Assessment & Plan:   ? ?1. Urinary retention ?-continue rapaflo 8mg  and finasteride 5mg  daily ?- Urinalysis, Routine w reflex microscopic ?- BLADDER SCAN AMB NON-IMAGING ? ? ?No follow-ups on file. ? ?Nicolette Bang, MD ? ?[facility] [place] ?  ?

## 2021-05-18 NOTE — Patient Instructions (Signed)
Acute Urinary Retention, Male °Acute urinary retention is when a person cannot pee (urinate) at all, or can only pee a little. This can come on all of a sudden. If it is not treated, it can lead to kidney problems or other serious problems. °What are the causes? °A problem with the tube that drains the bladder (urethra). °Problems with the nerves in the bladder. °Tumors. °Certain medicines. °An infection. °Having trouble pooping (constipation). °What increases the risk? °Older men are more at risk because their prostate gland may become larger as they age. Other conditions also can increase risk. These include: °Diseases, such as multiple sclerosis. °Injury to the spinal cord. °Diabetes. °A condition that affects the way the brain works, such as dementia. °Holding back urine due to trauma or because you do not want to use the bathroom. °What are the signs or symptoms? °Trouble peeing. °Pain in the lower belly. °How is this treated? °Treatment for this condition may include: °Medicines. °Placing a thin, germ-free tube (catheter) into the bladder to drain pee out of the body. °Therapy to treat mental health conditions. °Treatment for conditions that may cause this. °If needed, you may be treated in the hospital for kidney problems or to manage other problems. °Follow these instructions at home: °Medicines °Take over-the-counter and prescription medicines only as told by your doctor. Ask your doctor what medicines you should stay away from. °If you were given an antibiotic medicine, take it as told by your doctor. Do not stop taking it, even if you start to feel better. °General instructions °Do not smoke or use any products that contain nicotine or tobacco. If you need help quitting, ask your doctor. °Drink enough fluid to keep your pee pale yellow. °If you were sent home with a tube that drains the bladder, take care of it as told by your doctor. °Watch for changes in your symptoms. Tell your doctor about them. °If  told, keep track of changes in your blood pressure at home. Tell your doctor about them. °Keep all follow-up visits. °Contact a doctor if: °You have spasms in your bladder that you cannot stop. °You leak pee when you have spasms. °Get help right away if: °You have chills or a fever. °You have blood in your pee. °You have a tube that drains pee from the bladder and these things happen: °The tube stops draining pee. °The tube falls out. °Summary °Acute urinary retention is when you cannot pee at all or you pee too little. °If this condition is not treated, it can lead to kidney problems or other serious problems. °If you were sent home with a tube (catheter) that drains the bladder, take care of it as told by your doctor. °Watch for changes in your symptoms. Tell your doctor about them. °This information is not intended to replace advice given to you by your health care provider. Make sure you discuss any questions you have with your health care provider. °Document Revised: 11/24/2019 Document Reviewed: 11/24/2019 °Elsevier Patient Education © 2022 [employer] ° °

## 2021-05-21 ENCOUNTER — Encounter: Payer: Self-pay | Admitting: Urology

## 2021-05-21 NOTE — Patient Instructions (Signed)
Acute Urinary Retention, Male ?Acute urinary retention is a condition in which a person is unable to pass urine or can only pass a little urine. This condition can happen suddenly and last for a short time. If left untreated, it can become long-term (chronic) and result in kidney damage or other serious complications. ?What are the causes? ?This condition may be caused by: ?Obstruction or narrowing of the tube that drains the bladder (urethra). This may be caused by surgery, problems with nearby organs, or injury to the bladder or urethra. ?Problems with the nerves in the bladder. ?Tumors in the area of the pelvis, bladder, or urethra. ?Certain medicines. ?Bladder or urinary tract infection. ?Constipation. ?What increases the risk? ?This condition is more likely to develop in older men. As men age, their prostate may become larger and may start to press or squeeze on the bladder or the urethra. Other chronic health conditions can increase the risk of acute urinary retention. These include: ?Diseases such as multiple sclerosis. ?Spinal cord injuries. ?Diabetes. ?Degenerative cognitive conditions, such as delirium or dementia. ?Psychological conditions. A man may hold his urine due to trauma or because he does not want to use the bathroom. ?What are the signs or symptoms? ?Symptoms of this condition include: ?Trouble urinating. ?Pain in the lower abdomen. ?How is this diagnosed? ?This condition is diagnosed based on a physical exam and your medical history. You may also have other tests, including: ?An ultrasound of the bladder or kidneys or both. ?Blood tests. ?A urine analysis. ?Additional tests may be needed, such as a CT scan, MRI, and kidney or bladder function tests. ?How is this treated? ?Treatment for this condition may include: ?Medicines. ?Placing a thin, sterile tube (catheter) into the bladder to drain urine out of the body. This is called an indwelling urinary catheter. After it is inserted, the catheter  is held in place with a small balloon that is filled with sterile water. Urine drains from the catheter into a collection bag outside of the body. ?Behavioral therapy. ?Treatment for other conditions. ?If needed, you may be treated in the hospital for kidney function problems or to manage other complications. ?Follow these instructions at home: ?Medicines ?Take over-the-counter and prescription medicines only as told by your health care provider. Avoid certain medicines, such as decongestants, antihistamines, and some prescription medicines. Do not take any medicine unless your health care provider approves. ?If you were prescribed an antibiotic medicine, take it as told by your health care provider. Do not stop using the antibiotic even if you start to feel better. ?General instructions ?Do not use any products that contain nicotine or tobacco. These products include cigarettes, chewing tobacco, and vaping devices, such as e-cigarettes. If you need help quitting, ask your health care provider. ?Drink enough fluid to keep your urine pale yellow. ?If you have an indwelling urinary catheter, follow the instructions from your health care provider. ?Monitor any changes in your symptoms. Tell your health care provider about any changes. ?If instructed, monitor your blood pressure at home. Report changes as told by your health care provider. ?Keep all follow-up visits. This is important. ?Contact a health care provider if: ?You have uncomfortable bladder contractions that you cannot control (spasms). ?You leak urine with the spasms. ?Get help right away if: ?You have chills or a fever. ?You have blood in your urine. ?You have a catheter and the following happens: ?Your catheter stops draining urine. ?Your catheter falls out. ?Summary ?Acute urinary retention is a condition in which  a person is unable to pass urine or can only pass a little urine. If left untreated, this condition can result in kidney damage or other  serious complications. ?An enlarged prostate may cause this condition. As men age, their prostate gland may become larger and may press or squeeze on the bladder or the urethra. ?Treatment for this condition may include medicines and placement of an indwelling urinary catheter. ?Monitor any changes in your symptoms. Tell your health care provider about any changes. ?This information is not intended to replace advice given to you by your health care provider. Make sure you discuss any questions you have with your health care provider. ?Document Revised: 11/24/2019 Document Reviewed: 11/24/2019 ?Elsevier Patient Education ? [place]. ? ?

## 2021-05-28 DIAGNOSIS — Z20828 Contact with and (suspected) exposure to other viral communicable diseases: Secondary | ICD-10-CM | POA: Diagnosis not present

## 2021-06-02 DIAGNOSIS — Z20822 Contact with and (suspected) exposure to covid-19: Secondary | ICD-10-CM | POA: Diagnosis not present

## 2021-06-12 ENCOUNTER — Other Ambulatory Visit: Payer: Self-pay

## 2021-06-12 ENCOUNTER — Encounter: Payer: Self-pay | Admitting: Urology

## 2021-06-12 ENCOUNTER — Ambulatory Visit (INDEPENDENT_AMBULATORY_CARE_PROVIDER_SITE_OTHER): Payer: Medicare Other | Admitting: Urology

## 2021-06-12 VITALS — BP 166/75 | HR 69

## 2021-06-12 DIAGNOSIS — N401 Enlarged prostate with lower urinary tract symptoms: Secondary | ICD-10-CM

## 2021-06-12 DIAGNOSIS — N138 Other obstructive and reflux uropathy: Secondary | ICD-10-CM | POA: Diagnosis not present

## 2021-06-12 DIAGNOSIS — R339 Retention of urine, unspecified: Secondary | ICD-10-CM

## 2021-06-12 LAB — BLADDER SCAN AMB NON-IMAGING: Scan Result: 203

## 2021-06-12 MED ORDER — FINASTERIDE 5 MG PO TABS: 5.0000 mg | ORAL_TABLET | Freq: Every day | ORAL | 3 refills | Status: DC

## 2021-06-12 MED ORDER — TAMSULOSIN HCL 0.4 MG PO CAPS: 0.4000 mg | ORAL_CAPSULE | Freq: Two times a day (BID) | ORAL | 3 refills | Status: DC

## 2021-06-12 NOTE — Progress Notes (Signed)
post void residual=203 ?

## 2021-06-12 NOTE — Progress Notes (Signed)
? ?06/12/2021 ?10:32 AM  ? ?Timothy Tran ?1940/07/22 ?[id number] ? ?Referring provider: Celene Squibb, MD ?[address] ?Timothy Tran ?[place],  [place] [address] ? ?Followup urinary retention ? ? ?HPI: ?Timothy Tran is a 81yo here for followup for BPh with urinary retention. IPSS 7 QOl 1 on flomax BID and fiansteride '5mg'$ . He tried rapaflo '8mg'$  daily which did not improve his LUTS so he switched back to flomax. PVR 203cc. Urine stream strong. Nocturia 1x.  No other complaints today ? ? ?PMH: ?Past Medical History:  ?Diagnosis Date  ? CAD (coronary artery disease)   ? a. s/p stenting of LAD and RI in 2008 b. low-risk NST in 05/2008  ? Dyslipidemia   ? OSA on CPAP   ? ? ?Surgical History: ?Past Surgical History:  ?Procedure Laterality Date  ? CATARACT EXTRACTION W/PHACO Left 07/23/2019  ? Procedure: CATARACT EXTRACTION PHACO AND INTRAOCULAR LENS PLACEMENT (IOC);  Surgeon: Timothy Goldmann, MD;  Location: [facility];  Service: Ophthalmology;  Laterality: Left;  CDE: 17.80  ? CATARACT EXTRACTION W/PHACO Right 08/06/2019  ? Procedure: CATARACT EXTRACTION PHACO AND INTRAOCULAR LENS PLACEMENT RIGHT EYE;  Surgeon: Timothy Goldmann, MD;  Location: [facility];  Service: Ophthalmology;  Laterality: Right;  CDE: 9.77  ? CORONARY ANGIOPLASTY WITH STENT PLACEMENT  09/16/2006  ? stenting of the prox. LAD and ramus intermediate coronary artery  ? NM MYOCAR PERF WALL MOTION  06/02/2008  ? mild, small perfusion defect in the basal inferior,mid inferior & apicl inferior regions. Abnormal study. Results unchanged from previous study.  Low risk scan.  ? US ECHOCARDIOGRAPHY  05/20/2008  ? mild Timothy,TR and trace PI.  ? ? ?Home Medications:  ?Allergies as of 06/12/2021   ? ?   Reactions  ? Gabapentin   ? ?  ? ?  ?Medication List  ?  ? ?  ? Accurate as of June 12, 2021 10:32 AM. If you have any questions, ask your nurse or doctor.  ?  ?  ? ?  ? ?amLODipine 2.5 MG tablet ?Commonly known as: NORVASC ?Take 1 tablet (2.5 mg total) by mouth daily. ?  ?ascorbic acid 500 MG  tablet ?Commonly known as: VITAMIN C ?Take 500 mg by mouth daily. ?  ?atorvastatin 40 MG tablet ?Commonly known as: LIPITOR ?Take 40 mg by mouth daily. ?  ?finasteride 5 MG tablet ?Commonly known as: PROSCAR ?Take 1 tablet (5 mg total) by mouth daily. ?  ?losartan 50 MG tablet ?Commonly known as: COZAAR ?Take 1 tablet (50 mg total) by mouth 2 (two) times daily. ?  ?nitroGLYCERIN 0.4 MG SL tablet ?Commonly known as: NITROSTAT ?Place 1 tablet under the tongue every 5 (five) minutes x 3 doses as needed for severe pain. If no relief after 2 nd dose, proceed to ED for an evaluation or call 911 ?  ?omeprazole 20 MG capsule ?Commonly known as: PRILOSEC ?Take 20 mg by mouth 2 (two) times daily before a meal. ?  ?silodosin 8 MG Caps capsule ?Commonly known as: RAPAFLO ?Take 1 capsule (8 mg total) by mouth at bedtime. ?  ?tamsulosin 0.4 MG Caps capsule ?Commonly known as: FLOMAX ?Take 0.4 mg by mouth 2 (two) times daily. ?  ?traZODone 50 MG tablet ?Commonly known as: DESYREL ?Take 50 mg by mouth at bedtime. ?  ?VITAMIN D-3 PO ?Take 2,000 Units by mouth daily. ?  ?zinc gluconate 50 MG tablet ?Take 50 mg by mouth daily. ?  ? ?  ? ? ?Allergies:  ?Allergies  ?Allergen  Reactions  ? Gabapentin   ? ? ?Family History: ?Family History  ?Problem Relation Age of Onset  ? Hyperlipidemia Mother   ? Hyperlipidemia Sister   ? Hyperlipidemia Brother   ? ? ?Social History:  reports that he quit smoking about 41 years ago. His smoking use included cigarettes. He has never used smokeless tobacco. He reports that he does not drink alcohol and does not use drugs. ? ?ROS: ?All other review of systems were reviewed and are negative except what is noted above in HPI ? ?Physical Exam: ?BP (!) 166/75   Pulse 69   ?Constitutional:  Alert and oriented, No acute distress. ?HEENT:  AT, moist mucus membranes.  Trachea midline, no masses. ?Cardiovascular: No clubbing, cyanosis, or edema. ?Respiratory: Normal respiratory effort, no increased work of  breathing. ?GI: Abdomen is soft, nontender, nondistended, no abdominal masses ?GU: No CVA tenderness.  ?Lymph: No cervical or inguinal lymphadenopathy. ?Skin: No rashes, bruises or suspicious lesions. ?Neurologic: Grossly intact, no focal deficits, moving all 4 extremities. ?Psychiatric: Normal mood and affect. ? ?Laboratory Data: ?Lab Results  ?Component Value Date  ? WBC 7.3 05/07/2021  ? HGB 16.1 05/07/2021  ? HCT 48.2 05/07/2021  ? MCV 87.3 05/07/2021  ? PLT 159 05/07/2021  ? ? ?Lab Results  ?Component Value Date  ? CREATININE 1.14 05/07/2021  ? ? ?No results found for: PSA ? ?No results found for: TESTOSTERONE ? ?No results found for: HGBA1C ? ?Urinalysis ?   ?Component Value Date/Time  ? [place] YELLOW 05/07/2021 2005  ? APPEARANCEUR Clear 05/18/2021 1232  ? LABSPEC 1.005 05/07/2021 2005  ? PHURINE 5.0 05/07/2021 2005  ? GLUCOSEU Negative 05/18/2021 1232  ? HGBUR MODERATE (A) 05/07/2021 2005  ? BILIRUBINUR Negative 05/18/2021 1232  ? Benjamin Stain NEGATIVE 05/07/2021 2005  ? PROTEINUR Negative 05/18/2021 1232  ? PROTEINUR 30 (A) 05/07/2021 2005  ? NITRITE Negative 05/18/2021 1232  ? NITRITE POSITIVE (A) 05/07/2021 2005  ? LEUKOCYTESUR Negative 05/18/2021 1232  ? LEUKOCYTESUR NEGATIVE 05/07/2021 2005  ? ? ?Lab Results  ?Component Value Date  ? LABMICR Comment 05/18/2021  ? BACTERIA RARE (A) 05/07/2021  ? ? ?Pertinent Imaging: ? ?No results found for this or any previous visit. ? ?No results found for this or any previous visit. ? ?No results found for this or any previous visit. ? ?No results found for this or any previous visit. ? ?No results found for this or any previous visit. ? ?No results found for this or any previous visit. ? ?No results found for this or any previous visit. ? ?No results found for this or any previous visit. ? ? ?Assessment & Plan:   ? ?1. Urinary retention ?-continue finasteride and restart flomax 0.'4mg'$  BID ?- BLADDER SCAN AMB NON-IMAGING ?- Urinalysis, Routine w reflex  microscopic ? ?2. Benign prostatic hyperplasia with urinary obstruction ?-finasterdie '5mg'$  daily and flomax BID ?- BLADDER SCAN AMB NON-IMAGING ?- Urinalysis, Routine w reflex microscopic ? ? ?No follow-ups on file. ? ?Nicolette Bang, MD ? ?[facility] ?  ?

## 2021-06-12 NOTE — Patient Instructions (Signed)

## 2021-06-13 LAB — URINALYSIS, ROUTINE W REFLEX MICROSCOPIC
Bilirubin, UA: NEGATIVE
Glucose, UA: NEGATIVE
Ketones, UA: NEGATIVE
Leukocytes,UA: NEGATIVE
Nitrite, UA: NEGATIVE
Protein,UA: NEGATIVE
RBC, UA: NEGATIVE
Specific Gravity, UA: 1.025 (ref 1.005–1.030)
Urobilinogen, Ur: 0.2 mg/dL (ref 0.2–1.0)
pH, UA: 5.5 (ref 5.0–7.5)

## 2021-06-19 DIAGNOSIS — Z20822 Contact with and (suspected) exposure to covid-19: Secondary | ICD-10-CM | POA: Diagnosis not present

## 2021-07-11 DIAGNOSIS — Z20822 Contact with and (suspected) exposure to covid-19: Secondary | ICD-10-CM | POA: Diagnosis not present

## 2021-07-14 DIAGNOSIS — Z20822 Contact with and (suspected) exposure to covid-19: Secondary | ICD-10-CM | POA: Diagnosis not present

## 2021-07-22 DIAGNOSIS — Z20822 Contact with and (suspected) exposure to covid-19: Secondary | ICD-10-CM | POA: Diagnosis not present

## 2021-07-23 DIAGNOSIS — Z20822 Contact with and (suspected) exposure to covid-19: Secondary | ICD-10-CM | POA: Diagnosis not present

## 2021-07-26 DIAGNOSIS — Z20822 Contact with and (suspected) exposure to covid-19: Secondary | ICD-10-CM | POA: Diagnosis not present

## 2021-08-22 NOTE — Progress Notes (Signed)
Cardiology Office Note    Date:  08/23/2021   ID:  Timothy Tran, DOB [birth year], MRN [id number]  PCP:  Celene Squibb, MD  Cardiologist: Previously followed by Dr. Bronson Ing --> Patient requests to follow-up with Dr. Domenic Polite as he is the Primary Cardiologist for his wife as well.   Chief Complaint  Patient presents with   Follow-up    6 month visit    History of Present Illness:    Timothy Tran is a 81 y.o. male  with past medical history of CAD (s/p stenting of LAD and RI in 2008, low-risk NST in 05/2008), HTN, HLD and OSA who presents to the office today for 59-monthfollow-up.  He was examined by myself in 10/2020 and reported overall doing well at that time. He was walking for a mile each day and denied any recent anginal symptoms with this. Given it had been 12 years since his last ischemic evaluation, a Lexiscan Myoview was recommended for ischemic evaluation. BP was also above goal and he was started on Amlodipine 2.5 mg daily while continuing on Losartan 50 mg twice daily. His stress test showed a small defect within the apical to basal inferior location which was partially reversible but was overall felt to be most consistent with artifact caused by bowel tracer uptake and diaphragmatic attenuation as compared to ischemia. Was overall a low-risk study.  In talking with the patient and his wife today, he reports overall doing well from a cardiac perspective since his last office visit. He enjoys being active outside and will sometimes push mow their entire 2 acres without any chest pain or dyspnea on exertion. No recent orthopnea, PND or pitting edema. His activity has been more limited over the past several days as he developed an area of swelling behind his right knee. He has been elevating his leg and using ice with improvement in symptoms.   Past Medical History:  Diagnosis Date   CAD (coronary artery disease)    a. s/p stenting of LAD and RI in 2008 b. low-risk NST in  05/2008 and low-risk NST in 10/2020   Dyslipidemia    OSA on CPAP     Past Surgical History:  Procedure Laterality Date   CATARACT EXTRACTION W/PHACO Left 07/23/2019   Procedure: CATARACT EXTRACTION PHACO AND INTRAOCULAR LENS PLACEMENT ([place];  Surgeon: WBaruch Goldmann MD;  Location: [facility];  Service: Ophthalmology;  Laterality: Left;  CDE: 17.80   CATARACT EXTRACTION W/PHACO Right 08/06/2019   Procedure: CATARACT EXTRACTION PHACO AND INTRAOCULAR LENS PLACEMENT RIGHT EYE;  Surgeon: WBaruch Goldmann MD;  Location: [facility];  Service: Ophthalmology;  Laterality: Right;  CDE: 9.77   CORONARY ANGIOPLASTY WITH STENT PLACEMENT  09/16/2006   stenting of the prox. LAD and ramus intermediate coronary artery   NM MYOCAR PERF WALL MOTION  06/02/2008   mild, small perfusion defect in the basal inferior,mid inferior & apicl inferior regions. Abnormal study. Results unchanged from previous study.  Low risk scan.   [place]  05/20[place]   mild MR,TR and trace PI.    Current Medications: Outpatient Medications Prior to Visit  Medication Sig Dispense Refill   amLODipine (NORVASC) 2.5 MG tablet Take 1 tablet (2.5 mg total) by mouth daily. 90 tablet 3   ascorbic acid (VITAMIN C) 500 MG tablet Take 500 mg by mouth daily.     aspirin EC 81 MG tablet Take 81 mg by mouth daily. Swallow whole.     atorvastatin (LIPITOR) 40 MG  tablet Take 40 mg by mouth daily.      Cholecalciferol (VITAMIN D-3 PO) Take 2,000 Units by mouth daily.      finasteride (PROSCAR) 5 MG tablet Take 1 tablet (5 mg total) by mouth daily. 90 tablet 3   losartan (COZAAR) 50 MG tablet Take 1 tablet (50 mg total) by mouth 2 (two) times daily. 180 tablet 3   nitroGLYCERIN (NITROSTAT) 0.4 MG SL tablet Place 1 tablet under the tongue every 5 (five) minutes x 3 doses as needed for severe pain. If no relief after 2 nd dose, proceed to ED for an evaluation or call 911     omeprazole (PRILOSEC) 20 MG capsule Take 20 mg by mouth 2 (two) times daily  before a meal.      tamsulosin (FLOMAX) 0.4 MG CAPS capsule Take 1 capsule (0.4 mg total) by mouth 2 (two) times daily. 180 capsule 3   traZODone (DESYREL) 50 MG tablet Take 50 mg by mouth at bedtime.      zinc gluconate 50 MG tablet Take 50 mg by mouth daily.     No facility-administered medications prior to visit.     Allergies:   Gabapentin   Social History   Socioeconomic History   Marital status: Married    Spouse name: Not on file   Number of children: Not on file   Years of education: Not on file   Highest education level: Not on file  Occupational History   Not on file  Tobacco Use   Smoking status: Former    Types: Cigarettes    Quit date: 03/17/1980    Years since quitting: 41.4   Smokeless tobacco: Never  Vaping Use   Vaping Use: Never used  Substance and Sexual Activity   Alcohol use: No   Drug use: No   Sexual activity: Not on file  Other Topics Concern   Not on file  Social History Narrative   Not on file   Social Determinants of Health   Financial Resource Strain: Not on file  Food Insecurity: Not on file  Transportation Needs: Not on file  Physical Activity: Not on file  Stress: Not on file  Social Connections: Not on file     Family History:  The patient's family history includes Hyperlipidemia in his brother, mother, and sister.   Review of Systems:    Please see the history of present illness.     All other systems reviewed and are otherwise negative except as noted above.   Physical Exam:    VS:  BP 138/82   Pulse (!) 57   Ht '5\' 10"'$  (1.778 m)   Wt 189 lb 9.6 oz (86 kg)   SpO2 98%   BMI 27.20 kg/m    General: Well developed, well nourished,male appearing in no acute distress. Head: Normocephalic, atraumatic. Neck: No carotid bruits. JVD not elevated.  Lungs: Respirations regular and unlabored, without wheezes or rales.  Heart: Regular rate and rhythm. No S3 or S4.  No murmur, no rubs, or gallops appreciated. Abdomen: Appears  non-distended. No obvious abdominal masses. Msk:  Strength and tone appear normal for age. Cystic protrusion along posterior aspect of right knee.  Extremities: No clubbing or cyanosis. No pitting edema.  Distal pedal pulses are 2+ bilaterally. Neuro: Alert and oriented X 3. Moves all extremities spontaneously. No focal deficits noted. Psych:  Responds to questions appropriately with a normal affect. Skin: No rashes or lesions noted  Wt Readings from Last 3 Encounters:  08/23/21 189 lb 9.6 oz (86 kg)  05/18/21 196 lb (88.9 kg)  11/09/20 194 lb (88 kg)     Studies/Labs Reviewed:   EKG:  EKG is ordered today. The EKG ordered today demonstrates sinus bradycardia, HR 57 with PAC's and incomplete RBBB.   Recent Labs: 05/07/2021: BUN 19; Creatinine, Ser 1.14; Hemoglobin 16.1; Platelets 159; Potassium 3.5; Sodium 134   Lipid Panel    Component Value Date/Time   CHOL 138 12/23/2014 0814   TRIG 67 12/23/2014 0814   HDL 48 12/23/2014 0814   CHOLHDL 2.9 12/23/2014 0814   VLDL 13 12/23/2014 0814   LDLCALC 77 12/23/2014 0814    Additional studies/ records that were reviewed today include:   NST: 10/2020 The study is low risk.   No ST deviation was noted.   Defect 1: There is a small defect with moderate reduction in uptake present in the apical to basal inferior location(s) that is partially reversible at the base. There is normal wall motion in the defect area. Consistent with artifact caused by bowel tracer uptake and diaphragmatic attenuation. Less likely mild basal inferior ischemia.   Left ventricular function is normal. End diastolic cavity size is normal. End systolic cavity size is normal.   Predominantly fixed inferior defect as described with partial reversibility at the base in the setting of both diaphragmatic attenuation also radiotracer uptake adjacent to the inferior wall on rest imaging.  Scar less likely given normal wall motion, cannot completely exclude a mild inferior  basal region of ischemia, but overall low risk.  LVEF normal at 53%.  Assessment:    1. Coronary artery disease involving native coronary artery of native heart without angina pectoris   2. Essential hypertension   3. Hyperlipidemia LDL goal <70   4. Right knee pain, unspecified chronicity      Plan:   In order of problems listed above:  1. CAD - He is s/p stenting of his LAD and RI in 2008. Most recent ischemic evaluation was a low-risk NST in 10/2020.  - He remains active at baseline and denies any recent anginal symptoms.  - Continue current medical therapy with Atorvastatin '40mg'$  daily and PRN SL NTG. He has not been on beta-blocker therapy due to baseline bradycardia. Will restart ASA '81mg'$  daily as he is unsure why this is not on his current medication list but previously tolerated well.   2. HTN - His BP is at 138/82 during today's visit. Continue current medical therapy with Amlodipine 2.'5mg'$  daily and Losartan '50mg'$  BID.   3. HLD - Followed by his PCP. Will request a copy of most recent records. Continue Atorvastatin '40mg'$  daily.   4. Right Knee Pain - He has developed more significant right knee pain and has a cystic protrusion along the posterior aspect of his knee concerning for a Baker's cyst. Encouraged rest and elevation along with application of ice. Will refer to Orthopedics for additional evaluation. No pain or discomfort along his lower leg to raise concern for a DVT at this time as the swelling is isolated to his knee.     Medication Adjustments/Labs and Tests Ordered: Current medicines are reviewed at length with the patient today.  Concerns regarding medicines are outlined above.  Medication changes, Labs and Tests ordered today are listed in the Patient Instructions below. Patient Instructions  Medication Instructions:   START Aspirin 81 mg daily  Labwork: None today  Testing/Procedures: None  Follow-Up: 6 months  Any Other Special Instructions Will Be  Listed Below (If Applicable).   You have been referred to Ortho   If you need a refill on your cardiac medications before your next appointment, please call your pharmacy.    Signed, Erma Heritage, PA-C  08/23/2021 5:11 PM    [facility] [address] [place], [place] [address] Phone: [phone] Fax: [phone]

## 2021-08-23 ENCOUNTER — Ambulatory Visit (INDEPENDENT_AMBULATORY_CARE_PROVIDER_SITE_OTHER): Payer: Medicare Other | Admitting: Student

## 2021-08-23 ENCOUNTER — Encounter: Payer: Self-pay | Admitting: Student

## 2021-08-23 VITALS — BP 138/82 | HR 57 | Ht 70.0 in | Wt 189.6 lb

## 2021-08-23 DIAGNOSIS — N401 Enlarged prostate with lower urinary tract symptoms: Secondary | ICD-10-CM | POA: Diagnosis not present

## 2021-08-23 DIAGNOSIS — I2510 Atherosclerotic heart disease of native coronary artery without angina pectoris: Secondary | ICD-10-CM | POA: Diagnosis not present

## 2021-08-23 DIAGNOSIS — E785 Hyperlipidemia, unspecified: Secondary | ICD-10-CM | POA: Diagnosis not present

## 2021-08-23 DIAGNOSIS — I10 Essential (primary) hypertension: Secondary | ICD-10-CM | POA: Diagnosis not present

## 2021-08-23 DIAGNOSIS — R7301 Impaired fasting glucose: Secondary | ICD-10-CM | POA: Diagnosis not present

## 2021-08-23 DIAGNOSIS — E782 Mixed hyperlipidemia: Secondary | ICD-10-CM | POA: Diagnosis not present

## 2021-08-23 DIAGNOSIS — M25561 Pain in right knee: Secondary | ICD-10-CM

## 2021-08-23 NOTE — Patient Instructions (Addendum)
Medication Instructions:   START Aspirin 81 mg daily  Labwork: None today  Testing/Procedures: None  Follow-Up: 6 months  Any Other Special Instructions Will Be Listed Below (If Applicable).   You have been referred to Ortho   If you need a refill on your cardiac medications before your next appointment, please call your pharmacy.

## 2021-08-29 DIAGNOSIS — R809 Proteinuria, unspecified: Secondary | ICD-10-CM | POA: Insufficient documentation

## 2021-08-30 DIAGNOSIS — E669 Obesity, unspecified: Secondary | ICD-10-CM | POA: Diagnosis not present

## 2021-08-30 DIAGNOSIS — R809 Proteinuria, unspecified: Secondary | ICD-10-CM | POA: Diagnosis not present

## 2021-08-30 DIAGNOSIS — L299 Pruritus, unspecified: Secondary | ICD-10-CM | POA: Diagnosis not present

## 2021-08-30 DIAGNOSIS — N401 Enlarged prostate with lower urinary tract symptoms: Secondary | ICD-10-CM | POA: Diagnosis not present

## 2021-08-30 DIAGNOSIS — R32 Unspecified urinary incontinence: Secondary | ICD-10-CM | POA: Diagnosis not present

## 2021-08-30 DIAGNOSIS — K219 Gastro-esophageal reflux disease without esophagitis: Secondary | ICD-10-CM | POA: Diagnosis not present

## 2021-08-30 DIAGNOSIS — D696 Thrombocytopenia, unspecified: Secondary | ICD-10-CM | POA: Diagnosis not present

## 2021-08-30 DIAGNOSIS — Z6826 Body mass index (BMI) 26.0-26.9, adult: Secondary | ICD-10-CM | POA: Diagnosis not present

## 2021-08-30 DIAGNOSIS — G4700 Insomnia, unspecified: Secondary | ICD-10-CM | POA: Diagnosis not present

## 2021-08-30 DIAGNOSIS — E782 Mixed hyperlipidemia: Secondary | ICD-10-CM | POA: Diagnosis not present

## 2021-08-30 DIAGNOSIS — R7301 Impaired fasting glucose: Secondary | ICD-10-CM | POA: Diagnosis not present

## 2021-08-30 DIAGNOSIS — I10 Essential (primary) hypertension: Secondary | ICD-10-CM | POA: Diagnosis not present

## 2021-09-03 ENCOUNTER — Ambulatory Visit (INDEPENDENT_AMBULATORY_CARE_PROVIDER_SITE_OTHER): Payer: Medicare Other

## 2021-09-03 ENCOUNTER — Ambulatory Visit (INDEPENDENT_AMBULATORY_CARE_PROVIDER_SITE_OTHER): Payer: Medicare Other | Admitting: Orthopedic Surgery

## 2021-09-03 ENCOUNTER — Encounter: Payer: Self-pay | Admitting: Orthopedic Surgery

## 2021-09-03 VITALS — BP 204/171 | HR 60 | Ht 70.0 in | Wt 188.0 lb

## 2021-09-03 DIAGNOSIS — M25561 Pain in right knee: Secondary | ICD-10-CM | POA: Diagnosis not present

## 2021-09-03 DIAGNOSIS — M23321 Other meniscus derangements, posterior horn of medial meniscus, right knee: Secondary | ICD-10-CM

## 2021-09-03 DIAGNOSIS — I2510 Atherosclerotic heart disease of native coronary artery without angina pectoris: Secondary | ICD-10-CM | POA: Diagnosis not present

## 2021-09-03 DIAGNOSIS — M25461 Effusion, right knee: Secondary | ICD-10-CM

## 2021-09-03 MED ORDER — IBUPROFEN 800 MG PO TABS: 800.0000 mg | ORAL_TABLET | Freq: Three times a day (TID) | ORAL | 1 refills | Status: DC | PRN

## 2021-09-03 NOTE — Patient Instructions (Addendum)

## 2021-09-03 NOTE — Progress Notes (Signed)
Chief Complaint  Patient presents with   Knee Pain    Right for about 3 weeks after stepping off lawnmower    HPI: 81 year old male stepped off a lawn more and felt some pain in his right knee went ahead and finished mowing with a push mower but then over the 3 weeks time his pain is not decreased and his swelling has increased.  He has pain anterior and medially along the joint line of his right knee as well as a cyst that has formed on the back of the knee  Past Medical History:  Diagnosis Date   CAD (coronary artery disease)    a. s/p stenting of LAD and RI in 2008 b. low-risk NST in 05/2008 and low-risk NST in 10/2020   Dyslipidemia    OSA on CPAP     BP (!) 204/171   Pulse 60   Ht '5\' 10"'$  (1.778 m)   Wt 188 lb (85.3 kg)   BMI 26.98 kg/m    General appearance: Well-developed well-nourished no gross deformities  Cardiovascular normal pulse and perfusion normal color without edema  Neurologically no sensation loss or deficits or pathologic reflexes  Psychological: Awake alert and oriented x3 mood and affect normal  Skin no lacerations or ulcerations no nodularity no palpable masses, no erythema or nodularity  Musculoskeletal: Right knee effusion.  Patient is walking without assistive device there is no limp.  He has a slight flexion contracture appears to be chronic he can bend the knee to about 120 degrees and it becomes painful he is tender around the knee joint but especially on the medial joint line he has a positive McMurray's sign  Imaging our office images today show normal alignment with MILD OA    A/P  He probably has a medial meniscal tear with effusion  I recommended aspiration injection and I recommended he start ibuprofen 803 times a day and ice the knee 3 times a day return in 2 weeks  Encounter Diagnoses  Name Primary?   Acute pain of right knee Yes   Derangement of posterior horn of medial meniscus of right knee    Effusion, right knee    Procedure  note injection and aspiration right knee joint  Verbal consent was obtained to aspirate and inject the right knee joint   Timeout was completed to confirm the site of aspiration and injection  An 18-gauge needle was used to aspirate the knee joint from a suprapatellar lateral approach.  The medications used were 40 mg of Depo-Medrol and 1% lidocaine 3 cc  Anesthesia was provided by ethyl chloride and the skin was prepped with alcohol.  After cleaning the skin with alcohol an 18-gauge needle was used to aspirate the right knee joint.  We obtained 20  cc of fluid CLR YEL  We follow this by injection of 40 mg of Depo-Medrol and 3 cc 1% lidocaine.  There were no complications. A sterile bandage was applied.    Meds ordered this encounter  Medications   ibuprofen (ADVIL) 800 MG tablet    Sig: Take 1 tablet (800 mg total) by mouth every 8 (eight) hours as needed.    Dispense:  90 tablet    Refill:  1

## 2021-09-21 ENCOUNTER — Encounter: Payer: Self-pay | Admitting: Orthopedic Surgery

## 2021-09-21 ENCOUNTER — Ambulatory Visit (INDEPENDENT_AMBULATORY_CARE_PROVIDER_SITE_OTHER): Payer: Medicare Other | Admitting: Orthopedic Surgery

## 2021-09-21 DIAGNOSIS — I2510 Atherosclerotic heart disease of native coronary artery without angina pectoris: Secondary | ICD-10-CM

## 2021-09-21 DIAGNOSIS — M23321 Other meniscus derangements, posterior horn of medial meniscus, right knee: Secondary | ICD-10-CM

## 2021-09-21 NOTE — Progress Notes (Signed)
FOLLOW UP   Encounter Diagnosis  Name Primary?   Derangement of posterior horn of medial meniscus of right knee Yes     Chief Complaint  Patient presents with   Knee Pain    Feels much better after injury approx 5 weeks ago, states could walk home he feels so good.    Timothy Tran has improved to the point where he is not having any pain in either leg.  He can return to all normal activities  His exam was benign  Follow-up as needed

## 2021-12-07 ENCOUNTER — Ambulatory Visit: Payer: Medicare Other | Admitting: Urology

## 2021-12-10 ENCOUNTER — Ambulatory Visit (INDEPENDENT_AMBULATORY_CARE_PROVIDER_SITE_OTHER): Payer: Medicare Other | Admitting: Urology

## 2021-12-10 VITALS — BP 157/75 | HR 62

## 2021-12-10 DIAGNOSIS — R339 Retention of urine, unspecified: Secondary | ICD-10-CM

## 2021-12-10 DIAGNOSIS — N401 Enlarged prostate with lower urinary tract symptoms: Secondary | ICD-10-CM | POA: Diagnosis not present

## 2021-12-10 DIAGNOSIS — I2510 Atherosclerotic heart disease of native coronary artery without angina pectoris: Secondary | ICD-10-CM | POA: Diagnosis not present

## 2021-12-10 DIAGNOSIS — N138 Other obstructive and reflux uropathy: Secondary | ICD-10-CM

## 2021-12-10 LAB — URINALYSIS, ROUTINE W REFLEX MICROSCOPIC
Bilirubin, UA: NEGATIVE
Glucose, UA: NEGATIVE
Ketones, UA: NEGATIVE
Leukocytes,UA: NEGATIVE
Nitrite, UA: NEGATIVE
RBC, UA: NEGATIVE
Specific Gravity, UA: 1.02 (ref 1.005–1.030)
Urobilinogen, Ur: 0.2 mg/dL (ref 0.2–1.0)
pH, UA: 5.5 (ref 5.0–7.5)

## 2021-12-10 LAB — MICROSCOPIC EXAMINATION
Bacteria, UA: NONE SEEN
RBC, Urine: NONE SEEN /hpf (ref 0–2)

## 2021-12-10 LAB — BLADDER SCAN AMB NON-IMAGING: Scan Result: 111

## 2021-12-10 MED ORDER — TAMSULOSIN HCL 0.4 MG PO CAPS: 0.4000 mg | ORAL_CAPSULE | Freq: Two times a day (BID) | ORAL | 3 refills | Status: DC

## 2021-12-10 MED ORDER — FINASTERIDE 5 MG PO TABS: 5.0000 mg | ORAL_TABLET | Freq: Every day | ORAL | 3 refills | Status: DC

## 2021-12-10 NOTE — Progress Notes (Unsigned)
post void residual=111

## 2021-12-10 NOTE — Progress Notes (Unsigned)
12/10/2021 10:31 AM   Timothy Tran 1941-03-17 [id number]  Referring provider: Celene Squibb, MD [address] Timothy Tr[place],  [place] [id number]  Followup urinary retention   HPI: Timothy Tran is a 81yo here for followup for urinary retention and BPH. IPSS 5 QOL 0. He is flomax 0.'4mg'$  BID and finasteride '5mg'$  daily. POVR 111cc. Nocturia 0x.    PMH: Past Medical History:  Diagnosis Date   CAD (coronary artery disease)    a. s/p stenting of LAD and RI in 2008 b. low-risk NST in 05/2008 and low-risk NST in 10/2020   Dyslipidemia    OSA on CPAP     Surgical History: Past Surgical History:  Procedure Laterality Date   CATARACT EXTRACTION W/PHACO Left 07/23/2019   Procedure: CATARACT EXTRACTION PHACO AND INTRAOCULAR LENS PLACEMENT ([place]);  Surgeon: Timothy Goldmann, MD;  Location: [facility];  Service: Ophthalmology;  Laterality: Left;  CDE: 17.80   CATARACT EXTRACTION W/PHACO Right 08/06/2019   Procedure: CATARACT EXTRACTION PHACO AND INTRAOCULAR LENS PLACEMENT RIGHT EYE;  Surgeon: Timothy Goldmann, MD;  Location: [facility];  Service: Ophthalmology;  Laterality: Right;  CDE: 9.77   CORONARY ANGIOPLASTY WITH STENT PLACEMENT  09/16/2006   stenting of the prox. LAD and ramus intermediate coronary artery   NM MYOCAR PERF WALL MOTION  06/02/2008   mild, small perfusion defect in the basal inferior,mid inferior & apicl inferior regions. Abnormal study. Results unchanged from previous study.  Low risk scan.   US ECHOCARDIOGRAPHY  05/20/2008   mild Timothy,TR and trace PI.    Home Medications:  Allergies as of 12/10/2021       Reactions   Gabapentin         Medication List        Accurate as of December 10, 2021 10:31 AM. If you have any questions, ask your nurse or doctor.          amLODipine 2.5 MG tablet Commonly known as: NORVASC Take 1 tablet (2.5 mg total) by mouth daily.   ascorbic acid 500 MG tablet Commonly known as: VITAMIN C Take 500 mg by mouth daily.   aspirin EC 81 MG  tablet Take 81 mg by mouth daily. Swallow whole.   atorvastatin 40 MG tablet Commonly known as: LIPITOR Take 40 mg by mouth daily.   finasteride 5 MG tablet Commonly known as: PROSCAR Take 1 tablet (5 mg total) by mouth daily.   ibuprofen 800 MG tablet Commonly known as: ADVIL Take 1 tablet (800 mg total) by mouth every 8 (eight) hours as needed.   losartan 50 MG tablet Commonly known as: COZAAR Take 1 tablet (50 mg total) by mouth 2 (two) times daily.   nitroGLYCERIN 0.4 MG SL tablet Commonly known as: NITROSTAT Place 1 tablet under the tongue every 5 (five) minutes x 3 doses as needed for severe pain. If no relief after 2 nd dose, proceed to ED for an evaluation or call 911   omeprazole 20 MG capsule Commonly known as: PRILOSEC Take 20 mg by mouth 2 (two) times daily before a meal.   silodosin 8 MG Caps capsule Commonly known as: RAPAFLO Take 8 mg by mouth daily.   tamsulosin 0.4 MG Caps capsule Commonly known as: FLOMAX Take 1 capsule (0.4 mg total) by mouth 2 (two) times daily.   traZODone 50 MG tablet Commonly known as: DESYREL Take 50 mg by mouth at bedtime.   VITAMIN D-3 PO Take 2,000 Units by mouth daily.   zinc  gluconate 50 MG tablet Take 50 mg by mouth daily.        Allergies:  Allergies  Allergen Reactions   Gabapentin     Family History: Family History  Problem Relation Age of Onset   Hyperlipidemia Mother    Hyperlipidemia Sister    Hyperlipidemia Brother     Social History:  reports that he quit smoking about 41 years ago. His smoking use included cigarettes. He has never used smokeless tobacco. He reports that he does not drink alcohol and does not use drugs.  ROS: All other review of systems were reviewed and are negative except what is noted above in HPI  Physical Exam: BP (!) 157/75   Pulse 62   Constitutional:  Alert and oriented, No acute distress. HEENT: [name], moist mucus membranes.  Trachea midline, no  masses. Cardiovascular: No clubbing, cyanosis, or edema. Respiratory: Normal respiratory effort, no increased work of breathing. GI: Abdomen is soft, nontender, nondistended, no abdominal masses GU: No CVA tenderness.  Lymph: No cervical or inguinal lymphadenopathy. Skin: No rashes, bruises or suspicious lesions. Neurologic: Grossly intact, no focal deficits, moving all 4 extremities. Psychiatric: Normal mood and affect.  Laboratory Data: Lab Results  Component Value Date   WBC 7.3 05/07/2021   HGB 16.1 05/07/2021   HCT 48.2 05/07/2021   MCV 87.3 05/07/2021   PLT 159 05/07/2021    Lab Results  Component Value Date   CREATININE 1.14 05/07/2021    No results found for: "PSA"  No results found for: "TESTOSTERONE"  No results found for: "HGBA1C"  Urinalysis    Component Value Date/Time   COLORURINE YELLOW 05/07/2021 2005   APPEARANCEUR Clear 06/12/2021 1024   LABSPEC 1.005 05/07/2021 2005   PHURINE 5.0 05/07/2021 2005   GLUCOSEU Negative 06/12/2021 1024   HGBUR MODERATE (A) 05/07/2021 2005   BILIRUBINUR Negative 06/12/2021 1024   KETONESUR NEGATIVE 05/07/2021 2005   PROTEINUR Negative 06/12/2021 1024   PROTEINUR 30 (A) 05/07/2021 2005   NITRITE Negative 06/12/2021 1024   NITRITE POSITIVE (A) 05/07/2021 2005   LEUKOCYTESUR Negative 06/12/2021 1024   LEUKOCYTESUR NEGATIVE 05/07/2021 2005    Lab Results  Component Value Date   LABMICR Comment 05/18/2021   BACTERIA RARE (A) 05/07/2021    Pertinent Imaging: *** No results found for this or any previous visit.  No results found for this or any previous visit.  No results found for this or any previous visit.  No results found for this or any previous visit.  No results found for this or any previous visit.  No valid procedures specified. No results found for this or any previous visit.  No results found for this or any previous visit.   Assessment & Plan:    1. Urinary retention *** - BLADDER SCAN  AMB NON-IMAGING - Urinalysis, Routine w reflex microscopic  2. Benign prostatic hyperplasia with urinary obstruction ***   No follow-ups on file.  Timothy Bang, MD  [facility] [place]

## 2021-12-10 NOTE — Patient Instructions (Signed)

## 2021-12-13 ENCOUNTER — Encounter: Payer: Self-pay | Admitting: Urology

## 2022-02-25 DIAGNOSIS — E782 Mixed hyperlipidemia: Secondary | ICD-10-CM | POA: Diagnosis not present

## 2022-02-25 DIAGNOSIS — N401 Enlarged prostate with lower urinary tract symptoms: Secondary | ICD-10-CM | POA: Diagnosis not present

## 2022-02-25 DIAGNOSIS — R7301 Impaired fasting glucose: Secondary | ICD-10-CM | POA: Diagnosis not present

## 2022-02-28 DIAGNOSIS — R32 Unspecified urinary incontinence: Secondary | ICD-10-CM | POA: Diagnosis not present

## 2022-02-28 DIAGNOSIS — R7301 Impaired fasting glucose: Secondary | ICD-10-CM | POA: Diagnosis not present

## 2022-02-28 DIAGNOSIS — I10 Essential (primary) hypertension: Secondary | ICD-10-CM | POA: Diagnosis not present

## 2022-02-28 DIAGNOSIS — I2510 Atherosclerotic heart disease of native coronary artery without angina pectoris: Secondary | ICD-10-CM | POA: Diagnosis not present

## 2022-02-28 DIAGNOSIS — Z0000 Encounter for general adult medical examination without abnormal findings: Secondary | ICD-10-CM | POA: Diagnosis not present

## 2022-02-28 DIAGNOSIS — L299 Pruritus, unspecified: Secondary | ICD-10-CM | POA: Diagnosis not present

## 2022-02-28 DIAGNOSIS — D696 Thrombocytopenia, unspecified: Secondary | ICD-10-CM | POA: Diagnosis not present

## 2022-02-28 DIAGNOSIS — N401 Enlarged prostate with lower urinary tract symptoms: Secondary | ICD-10-CM | POA: Diagnosis not present

## 2022-02-28 DIAGNOSIS — E782 Mixed hyperlipidemia: Secondary | ICD-10-CM | POA: Diagnosis not present

## 2022-02-28 DIAGNOSIS — K219 Gastro-esophageal reflux disease without esophagitis: Secondary | ICD-10-CM | POA: Diagnosis not present

## 2022-02-28 DIAGNOSIS — G4700 Insomnia, unspecified: Secondary | ICD-10-CM | POA: Diagnosis not present

## 2022-02-28 DIAGNOSIS — Z23 Encounter for immunization: Secondary | ICD-10-CM | POA: Diagnosis not present

## 2022-03-05 ENCOUNTER — Encounter: Payer: Self-pay | Admitting: Cardiology

## 2022-03-05 NOTE — Progress Notes (Signed)
Cardiology Office Note  Date: 03/06/2022   ID: Jshon, Ibe 11/02/1940, MRN [id number]  PCP:  Celene Squibb, MD  Cardiologist:  Rozann Lesches, MD Electrophysiologist:  None   Chief Complaint  Patient presents with   Cardiac follow-up    History of Present Illness: Timothy Tran is an 81 y.o. male former patient of Dr. Bronson Ing last seen in the office by Ms. Strader PA-C back in June.  He is now establishing follow-up with me, I reviewed the available records and updated the chart.  He is here today with his wife.  Reports no angina or nitroglycerin use, NYHA class II dyspnea.  Enjoys working outdoors, was outside working on one of his cars this morning.  He does not report any palpitations, no dizziness or syncope.  Blood pressure was elevated today, rechecked by me in the right arm at 142/70.  He could not recall whether he took his morning antihypertensives or not.  He continues to follow with Dr. Nevada Crane for primary care.  I reviewed his recent lab work, LDL was 96 on Lipitor 40 mg daily which she is tolerating.  Ischemic testing from August of last year was low risk as noted below.  I reviewed his ECG from June.  Past Medical History:  Diagnosis Date   CAD (coronary artery disease)    Status post BMS to LAD and ramus intermedius 2008 - Dr. Nadyne Coombes   Dyslipidemia    OSA on CPAP     Past Surgical History:  Procedure Laterality Date   CATARACT EXTRACTION W/PHACO Left 07/23/2019   Procedure: CATARACT EXTRACTION PHACO AND INTRAOCULAR LENS PLACEMENT ([place]);  Surgeon: Baruch Goldmann, MD;  Location: [facility];  Service: Ophthalmology;  Laterality: Left;  CDE: 17.80   CATARACT EXTRACTION W/PHACO Right 08/06/2019   Procedure: CATARACT EXTRACTION PHACO AND INTRAOCULAR LENS PLACEMENT RIGHT EYE;  Surgeon: Baruch Goldmann, MD;  Location: [facility];  Service: Ophthalmology;  Laterality: Right;  CDE: 9.77   CORONARY ANGIOPLASTY WITH STENT PLACEMENT  09/16/2006   stenting of the prox. LAD and  ramus intermediate coronary artery   NM MYOCAR PERF WALL MOTION  06/02/2008   mild, small perfusion defect in the basal inferior,mid inferior & apicl inferior regions. Abnormal study. Results unchanged from previous study.  Low risk scan.   US ECHOCARDIOGRAPHY  05/20/2008   mild MR,TR and trace PI.    Current Outpatient Medications  Medication Sig Dispense Refill   amLODipine (NORVASC) 2.5 MG tablet Take 1 tablet (2.5 mg total) by mouth daily. 90 tablet 3   ascorbic acid (VITAMIN C) 500 MG tablet Take 500 mg by mouth daily.     aspirin EC 81 MG tablet Take 81 mg by mouth daily. Swallow whole.     atorvastatin (LIPITOR) 80 MG tablet Take 1 tablet (80 mg total) by mouth daily. 90 tablet 3   Cholecalciferol (VITAMIN D-3 PO) Take 2,000 Units by mouth daily.      finasteride (PROSCAR) 5 MG tablet Take 1 tablet (5 mg total) by mouth daily. 90 tablet 3   ibuprofen (ADVIL) 800 MG tablet Take 1 tablet (800 mg total) by mouth every 8 (eight) hours as needed. 90 tablet 1   losartan (COZAAR) 50 MG tablet Take 1 tablet (50 mg total) by mouth 2 (two) times daily. 180 tablet 3   nitroGLYCERIN (NITROSTAT) 0.4 MG SL tablet Place 1 tablet under the tongue every 5 (five) minutes x 3 doses as needed for severe pain. If no relief  after 2 nd dose, proceed to ED for an evaluation or call 911     omeprazole (PRILOSEC) 20 MG capsule Take 20 mg by mouth 2 (two) times daily before a meal.      tamsulosin (FLOMAX) 0.4 MG CAPS capsule Take 1 capsule (0.4 mg total) by mouth 2 (two) times daily. 180 capsule 3   traZODone (DESYREL) 50 MG tablet Take 50 mg by mouth at bedtime.      zinc gluconate 50 MG tablet Take 50 mg by mouth daily.     No current facility-administered medications for this visit.   Allergies:  Gabapentin   ROS: No orthopnea or PND.  Peripheral neuropathy symptoms.  Physical Exam: VS:  BP (!) 142/70 (BP Location: Left Arm, Patient Position: Sitting, Cuff Size: Normal)   Pulse 85   Ht '5\' 10"'$  (1.778 m)    Wt 198 lb (89.8 kg)   SpO2 98%   BMI 28.41 kg/m , BMI Body mass index is 28.41 kg/m.  Wt Readings from Last 3 Encounters:  03/06/22 198 lb (89.8 kg)  09/03/21 188 lb (85.3 kg)  08/23/21 189 lb 9.6 oz (86 kg)    General: Patient appears comfortable at rest. HEENT: Conjunctiva and lids normal. Neck: Supple, no elevated JVP or carotid bruits. Lungs: Clear to auscultation, nonlabored breathing at rest. Cardiac: Regular rate and rhythm, no S3, 7-6/1 systolic murmur. Abdomen: Soft, nontender, bowel sounds present. Extremities: No pitting edema.  ECG:  An ECG dated 08/23/2021 was personally reviewed today and demonstrated:  Sinus bradycardia with PAC, nonspecific ST changes.  Recent Labwork: 05/07/2021: BUN 19; Creatinine, Ser 1.14; Hemoglobin 16.1; Platelets 159; Potassium 3.5; Sodium 134  December 2023: Hemoglobin 16, platelets 125, BUN 15, creatinine 0.97, potassium 4.3, AST 19, ALT 24, cholesterol 173, triglycerides 166, HDL 48, LDL 96, hemoglobin A1c 5.5%  Other Studies Reviewed Today:  Lexiscan Myoview 11/14/2020:   The study is low risk.   No ST deviation was noted.   Defect 1: There is a small defect with moderate reduction in uptake present in the apical to basal inferior location(s) that is partially reversible at the base. There is normal wall motion in the defect area. Consistent with artifact caused by bowel tracer uptake and diaphragmatic attenuation. Less likely mild basal inferior ischemia.   Left ventricular function is normal. End diastolic cavity size is normal. End systolic cavity size is normal.   Predominantly fixed inferior defect as described with partial reversibility at the base in the setting of both diaphragmatic attenuation also radiotracer uptake adjacent to the inferior wall on rest imaging.  Scar less likely given normal wall motion, cannot completely exclude a mild inferior basal region of ischemia, but overall low risk.  LVEF normal at 53%.  Assessment and  Plan:  1.  CAD status post BMS to the LAD and ramus intermedius in 2008.  Follow-up ischemic testing from August of last year was low risk.  He does not describe any angina or nitroglycerin use, no change in stamina.  Plan to continue medical therapy and observation.  He is on aspirin, Cozaar, Norvasc, Lipitor, and as needed nitroglycerin.  2.  Mixed hyperlipidemia, recent LDL 96.  Increase Lipitor to 80 mg daily.  3.  Essential hypertension, blood pressure up today.  He reports compliance with Norvasc and Cozaar.  Continue to follow with PCP, could further increase Norvasc or perhaps add low-dose beta-blocker.  Medication Adjustments/Labs and Tests Ordered: Current medicines are reviewed at length with the patient today.  Concerns regarding medicines are outlined above.   Tests Ordered: No orders of the defined types were placed in this encounter.   Medication Changes: Meds ordered this encounter  Medications   atorvastatin (LIPITOR) 80 MG tablet    Sig: Take 1 tablet (80 mg total) by mouth daily.    Dispense:  90 tablet    Refill:  3    Disposition:  Follow up  6 months.  Signed, Satira Sark, MD, [facility] 03/06/2022 9:04 AM    [facility] [address], [place], [place] [address] Phone: [phone]; Fax: [phone]

## 2022-03-06 ENCOUNTER — Ambulatory Visit: Payer: Medicare Other | Attending: Cardiology | Admitting: Cardiology

## 2022-03-06 ENCOUNTER — Encounter: Payer: Self-pay | Admitting: Cardiology

## 2022-03-06 VITALS — BP 142/70 | HR 85 | Ht 70.0 in | Wt 198.0 lb

## 2022-03-06 DIAGNOSIS — E782 Mixed hyperlipidemia: Secondary | ICD-10-CM | POA: Diagnosis not present

## 2022-03-06 DIAGNOSIS — I25119 Atherosclerotic heart disease of native coronary artery with unspecified angina pectoris: Secondary | ICD-10-CM

## 2022-03-06 DIAGNOSIS — I10 Essential (primary) hypertension: Secondary | ICD-10-CM | POA: Diagnosis not present

## 2022-03-06 MED ORDER — ATORVASTATIN CALCIUM 80 MG PO TABS: 80.0000 mg | ORAL_TABLET | Freq: Every day | ORAL | 3 refills | Status: DC

## 2022-03-06 NOTE — Patient Instructions (Signed)
Medication Instructions:  Your physician has recommended you make the following change in your medication:  -Increase Lipitor to 80 mg tablets daily   Labwork: None  Testing/Procedures: None  Follow-Up: Follow up with Dr. Domenic Polite in 6 months.   Any Other Special Instructions Will Be Listed Below (If Applicable).     If you need a refill on your cardiac medications before your next appointment, please call your pharmacy.

## 2022-03-08 DIAGNOSIS — U071 COVID-19: Secondary | ICD-10-CM | POA: Diagnosis not present

## 2022-03-13 ENCOUNTER — Ambulatory Visit: Payer: Medicare Other | Admitting: Cardiovascular Disease

## 2022-03-22 ENCOUNTER — Other Ambulatory Visit: Payer: Self-pay | Admitting: Student

## 2022-06-26 DIAGNOSIS — D239 Other benign neoplasm of skin, unspecified: Secondary | ICD-10-CM | POA: Diagnosis not present

## 2022-06-26 DIAGNOSIS — L570 Actinic keratosis: Secondary | ICD-10-CM | POA: Diagnosis not present

## 2022-06-26 DIAGNOSIS — Z1283 Encounter for screening for malignant neoplasm of skin: Secondary | ICD-10-CM | POA: Diagnosis not present

## 2022-08-29 DIAGNOSIS — E782 Mixed hyperlipidemia: Secondary | ICD-10-CM | POA: Diagnosis not present

## 2022-08-29 DIAGNOSIS — R7301 Impaired fasting glucose: Secondary | ICD-10-CM | POA: Diagnosis not present

## 2022-09-03 DIAGNOSIS — D72819 Decreased white blood cell count, unspecified: Secondary | ICD-10-CM | POA: Insufficient documentation

## 2022-09-04 DIAGNOSIS — R809 Proteinuria, unspecified: Secondary | ICD-10-CM | POA: Diagnosis not present

## 2022-09-04 DIAGNOSIS — E782 Mixed hyperlipidemia: Secondary | ICD-10-CM | POA: Diagnosis not present

## 2022-09-04 DIAGNOSIS — D696 Thrombocytopenia, unspecified: Secondary | ICD-10-CM | POA: Diagnosis not present

## 2022-09-04 DIAGNOSIS — L299 Pruritus, unspecified: Secondary | ICD-10-CM | POA: Diagnosis not present

## 2022-09-04 DIAGNOSIS — D72819 Decreased white blood cell count, unspecified: Secondary | ICD-10-CM | POA: Diagnosis not present

## 2022-09-04 DIAGNOSIS — G629 Polyneuropathy, unspecified: Secondary | ICD-10-CM | POA: Insufficient documentation

## 2022-09-04 DIAGNOSIS — R7301 Impaired fasting glucose: Secondary | ICD-10-CM | POA: Diagnosis not present

## 2022-09-04 DIAGNOSIS — N401 Enlarged prostate with lower urinary tract symptoms: Secondary | ICD-10-CM | POA: Diagnosis not present

## 2022-09-04 DIAGNOSIS — I2510 Atherosclerotic heart disease of native coronary artery without angina pectoris: Secondary | ICD-10-CM | POA: Diagnosis not present

## 2022-09-04 DIAGNOSIS — G4700 Insomnia, unspecified: Secondary | ICD-10-CM | POA: Diagnosis not present

## 2022-09-04 DIAGNOSIS — K219 Gastro-esophageal reflux disease without esophagitis: Secondary | ICD-10-CM | POA: Diagnosis not present

## 2022-09-04 DIAGNOSIS — Z0000 Encounter for general adult medical examination without abnormal findings: Secondary | ICD-10-CM | POA: Diagnosis not present

## 2022-09-04 DIAGNOSIS — I10 Essential (primary) hypertension: Secondary | ICD-10-CM | POA: Diagnosis not present

## 2022-10-17 DIAGNOSIS — M792 Neuralgia and neuritis, unspecified: Secondary | ICD-10-CM | POA: Diagnosis not present

## 2022-10-17 DIAGNOSIS — R202 Paresthesia of skin: Secondary | ICD-10-CM | POA: Diagnosis not present

## 2022-10-17 DIAGNOSIS — G4700 Insomnia, unspecified: Secondary | ICD-10-CM | POA: Diagnosis not present

## 2022-10-17 DIAGNOSIS — L2081 Atopic neurodermatitis: Secondary | ICD-10-CM | POA: Diagnosis not present

## 2022-10-21 DIAGNOSIS — M792 Neuralgia and neuritis, unspecified: Secondary | ICD-10-CM | POA: Diagnosis not present

## 2022-11-10 ENCOUNTER — Other Ambulatory Visit: Payer: Self-pay | Admitting: Urology

## 2022-11-10 DIAGNOSIS — N401 Enlarged prostate with lower urinary tract symptoms: Secondary | ICD-10-CM

## 2022-11-21 ENCOUNTER — Ambulatory Visit: Payer: Medicare Other | Attending: Medical | Admitting: Medical

## 2022-11-21 ENCOUNTER — Ambulatory Visit (INDEPENDENT_AMBULATORY_CARE_PROVIDER_SITE_OTHER): Payer: Medicare Other

## 2022-11-21 ENCOUNTER — Encounter: Payer: Self-pay | Admitting: Medical

## 2022-11-21 VITALS — BP 160/70 | HR 46 | Ht 70.0 in | Wt 190.0 lb

## 2022-11-21 DIAGNOSIS — I25119 Atherosclerotic heart disease of native coronary artery with unspecified angina pectoris: Secondary | ICD-10-CM

## 2022-11-21 DIAGNOSIS — R001 Bradycardia, unspecified: Secondary | ICD-10-CM | POA: Diagnosis not present

## 2022-11-21 DIAGNOSIS — R5383 Other fatigue: Secondary | ICD-10-CM | POA: Diagnosis not present

## 2022-11-21 DIAGNOSIS — I10 Essential (primary) hypertension: Secondary | ICD-10-CM | POA: Diagnosis not present

## 2022-11-21 DIAGNOSIS — G4730 Sleep apnea, unspecified: Secondary | ICD-10-CM

## 2022-11-21 DIAGNOSIS — R011 Cardiac murmur, unspecified: Secondary | ICD-10-CM

## 2022-11-21 DIAGNOSIS — E782 Mixed hyperlipidemia: Secondary | ICD-10-CM | POA: Diagnosis not present

## 2022-11-21 MED ORDER — AMLODIPINE BESYLATE 5 MG PO TABS: 5.0000 mg | ORAL_TABLET | Freq: Every day | ORAL | 3 refills | Status: DC

## 2022-11-21 NOTE — Progress Notes (Signed)
Cardiology Office Note:    Date:  11/21/2022   ID:  Timothy Tran, DOB December 29, 1940, MRN [id number]  PCP:  Benita Stabile, MD  [facility] Cardiologist:  Nona Dell, MD  [facility] Electrophysiologist:  None   Referring MD: Benita Stabile, MD   Chief Complaint: 6 month follow-up  History of Present Illness:    Timothy Tran is a 82 y.o. male with a hx of CAD s/p stenting LAD and RI in 2008, low risk NST in 2010 and 2022, HTN, HLD, and OSA who presents for follow-up.   The patient was las seen 02/2022 and BP was elevated. On re-check it was better. No changes were made. Recommend follow-up with PCP.   Today, patient reports he is doing well from a cardiac perspective. He denies chest pain or shortness of breath.  Patient reports he has neuropathy in his legs and hands.  He has appointment with neurology tomorrow to further discuss this.  The neuropathy is so severe he has trouble sleeping at night.  Patient denies any lower leg edema.  Today EKG shows sinus bradycardia with a heart rate of 46 bpm.  It appears patient has a history of baseline bradycardia with heart rate in the 50s.  Patient denies any significant symptoms, although he has occasional dizziness..  Patient had a sleep study in 2008, but has not followed up with this.  Blood pressure today is moderately elevated.  Past Medical History:  Diagnosis Date   CAD (coronary artery disease)    Status post BMS to LAD and ramus intermedius 2008 - Dr. Nadara Eaton   Dyslipidemia    OSA on CPAP     Past Surgical History:  Procedure Laterality Date   CATARACT EXTRACTION W/PHACO Left 07/23/2019   Procedure: CATARACT EXTRACTION PHACO AND INTRAOCULAR LENS PLACEMENT (IOC);  Surgeon: Fabio Pierce, MD;  Location: [facility];  Service: Ophthalmology;  Laterality: Left;  CDE: 17.80   CATARACT EXTRACTION W/PHACO Right 08/06/2019   Procedure: CATARACT EXTRACTION PHACO AND INTRAOCULAR LENS PLACEMENT RIGHT EYE;  Surgeon: Fabio Pierce, MD;   Location: [facility];  Service: Ophthalmology;  Laterality: Right;  CDE: 9.77   CORONARY ANGIOPLASTY WITH STENT PLACEMENT  09/16/2006   stenting of the prox. LAD and ramus intermediate coronary artery   NM MYOCAR PERF WALL MOTION  06/02/2008   mild, small perfusion defect in the basal inferior,mid inferior & apicl inferior regions. Abnormal study. Results unchanged from previous study.  Low risk scan.   US ECHOCARDIOGRAPHY  05/20/2008   mild MR,TR and trace PI.    Current Medications: Current Meds  Medication Sig   amLODipine (NORVASC) 5 MG tablet Take 1 tablet (5 mg total) by mouth daily.   ascorbic acid (VITAMIN C) 500 MG tablet Take 500 mg by mouth daily.   aspirin EC 81 MG tablet Take 81 mg by mouth daily. Swallow whole.   atorvastatin (LIPITOR) 80 MG tablet Take 1 tablet (80 mg total) by mouth daily.   Cholecalciferol (VITAMIN D-3 PO) Take 2,000 Units by mouth daily.    finasteride (PROSCAR) 5 MG tablet Take 1 tablet (5 mg total) by mouth daily.   ibuprofen (ADVIL) 800 MG tablet Take 1 tablet (800 mg total) by mouth every 8 (eight) hours as needed.   losartan (COZAAR) 50 MG tablet TAKE 1 TABLET TWICE A DAY   nitroGLYCERIN (NITROSTAT) 0.4 MG SL tablet Place 1 tablet under the tongue every 5 (five) minutes x 3 doses as needed for severe pain.  If no relief after 2 nd dose, proceed to ED for an evaluation or call 911   omeprazole (PRILOSEC) 20 MG capsule Take 20 mg by mouth 2 (two) times daily before a meal.    tamsulosin (FLOMAX) 0.4 MG CAPS capsule TAKE 1 CAPSULE TWICE DAILY   traZODone (DESYREL) 50 MG tablet Take 50 mg by mouth at bedtime.    zinc gluconate 50 MG tablet Take 50 mg by mouth daily.   [DISCONTINUED] amLODipine (NORVASC) 2.5 MG tablet Take 1 tablet (2.5 mg total) by mouth daily.     Allergies:   Gabapentin   Social History   Socioeconomic History   Marital status: Married    Spouse name: Not on file   Number of children: Not on file   Years of education: Not on file    Highest education level: Not on file  Occupational History   Not on file  Tobacco Use   Smoking status: Former    Current packs/day: 0.00    Types: Cigarettes    Quit date: 03/17/1980    Years since quitting: 42.7   Smokeless tobacco: Never  Vaping Use   Vaping status: Never Used  Substance and Sexual Activity   Alcohol use: No   Drug use: No   Sexual activity: Not on file  Other Topics Concern   Not on file  Social History Narrative   Not on file   Social Determinants of Health   Financial Resource Strain: Not on file  Food Insecurity: Not on file  Transportation Needs: Not on file  Physical Activity: Not on file  Stress: Not on file  Social Connections: Not on file     Family History: The patient's family history includes Hyperlipidemia in his brother, mother, and sister.  ROS:   Please see the history of present illness.     All other systems reviewed and are negative.  EKGs/Labs/Other Studies Reviewed:    The following studies were reviewed today:  Myoview Lexiscan 2022   The study is low risk.   No ST deviation was noted.   Defect 1: There is a small defect with moderate reduction in uptake present in the apical to basal inferior location(s) that is partially reversible at the base. There is normal wall motion in the defect area. Consistent with artifact caused by bowel tracer uptake and diaphragmatic attenuation. Less likely mild basal inferior ischemia.   Left ventricular function is normal. End diastolic cavity size is normal. End systolic cavity size is normal.   Predominantly fixed inferior defect as described with partial reversibility at the base in the setting of both diaphragmatic attenuation also radiotracer uptake adjacent to the inferior wall on rest imaging.  Scar less likely given normal wall motion, cannot completely exclude a mild inferior basal region of ischemia, but overall low risk.  LVEF normal at 53%.    EKG:  EKG is ordered today.  The  ekg ordered today demonstrates SB 46bpm, 1st degree AV block  Recent Labs: No results found for requested labs within last 365 days.  Recent Lipid Panel    Component Value Date/Time   CHOL 138 12/23/2014 0814   TRIG 67 12/23/2014 0814   HDL 48 12/23/2014 0814   CHOLHDL 2.9 12/23/2014 0814   VLDL 13 12/23/2014 0814   LDLCALC 77 12/23/2014 0814     Physical Exam:    VS:  BP (!) 160/70   Pulse (!) 46   Ht 5\' 10"  (1.778 m)   Wt 190  lb (86.2 kg)   SpO2 97%   BMI 27.26 kg/m     Wt Readings from Last 3 Encounters:  11/21/22 190 lb (86.2 kg)  03/06/22 198 lb (89.8 kg)  09/03/21 188 lb (85.3 kg)     GEN:  Well nourished, well developed in no acute distress HEENT: Normal NECK: No JVD; No carotid bruits LYMPHATICS: No lymphadenopathy CARDIAC: bradycardia, RR, + murmur, no rubs, gallops RESPIRATORY:  Clear to auscultation without rales, wheezing or rhonchi  ABDOMEN: Soft, non-tender, non-distended MUSCULOSKELETAL:  No edema; No deformity  SKIN: Warm and dry NEUROLOGIC:  Alert and oriented x 3 PSYCHIATRIC:  Normal affect   ASSESSMENT:    1. Sinus bradycardia   2. Murmur   3. Sleep apnea, unspecified type   4. Coronary artery disease involving native coronary artery of native heart with angina pectoris (HCC)   5. Other fatigue   6. Hyperlipidemia, mixed   7. Essential hypertension    PLAN:    In order of problems listed above:  Sinus Bradycardia Patient has a long history of baseline bradycardia with heart rate is in the 50s.  Today EKG shows sinus bradycardia with heart rate of 46 bpm, first-degree AV block.  Patient denies any significant symptoms, although he has occasional dizziness.  He denies any chest pain or shortness of breath.  Patient is not on heart rate lowering medications.  I will update lab work today, CBC, c-Met, mag, TSH.  I will order a 2-week heart monitor.  I will also refer to pulmonology for follow-up of sleep apnea/sleep study.  Murmur I will  order an echocardiogram.  OSA Patient had a sleep study in 2008 showing moderate sleep apnea, although this was never followed up on.  I will refer patient back to pulmonology.  Hyperlipidemia I will update lipid profile today.  Continue Lipitor 80 mg daily.  Hypertension Blood pressure is moderately elevated.  I will increase amlodipine to 5 mg daily.  Continue losartan 50 mg twice daily.  CAD s/p prior stenting Patient denies any chest pain or shortness of breath.  Patient stays very active.  No further ischemic workup indicated at this time.  Continue aspirin 81 mg daily, Lipitor 80 mg daily, losartan 50 mg twice daily.  No beta-blocker due to baseline bradycardia.  Disposition: Follow up in 2 month(s) with MD/APP    Signed, Tyara Dassow David Stall, PA-C  11/21/2022 1:36 PM    [facility]

## 2022-11-21 NOTE — Patient Instructions (Signed)
Medication Instructions:  INCREASE Amlodipine to 5 mg daily  Labwork: Cbc,bmet,tsh,lipids,magnesium  Testing/Procedures: Your physician has requested that you have an echocardiogram. Echocardiography is a painless test that uses sound waves to create images of your heart. It provides your doctor with information about the size and shape of your heart and how well your heart's chambers and valves are working. This procedure takes approximately one hour. There are no restrictions for this procedure. Please do NOT wear cologne, perfume, aftershave, or lotions (deodorant is allowed). Please arrive 15 minutes prior to your appointment time.    ZIO XT- Long Term Monitor Instructions   Your physician has requested you wear your ZIO patch monitor_____14__days.   This is a single patch monitor.  [employer] supplies one patch monitor per enrollment.  Additional stickers are not available.   Please do not apply patch if you will be having a Nuclear Stress Test, Echocardiogram, Cardiac CT, MRI, or Chest Xray during the time frame you would be wearing the monitor. The patch cannot be worn during these tests.  You cannot remove and re-apply the ZIO XT patch monitor.   Do not shower for the first 24 hours.  You may shower after the first 24 hours.   Press button if you feel a symptom. You will hear a small click.  Record Date, Time and Symptom in the Patient Log Book.   When you are ready to remove patch, follow instructions on last 2 pages of Patient Log Book.  Stick patch monitor onto last page of Patient Log Book.   Place Patient Log Book in [place] box.  Use locking tab on box and tape box closed securely.  The [employer] and [employer] has [employer] on it.  Please place in mailbox as soon as possible.  Your physician should have your test results approximately 7 days after the monitor has been mailed back to [facility].   Call [facility] Customer Care at [phone] if you have questions  regarding your ZIO XT patch monitor.  Call them immediately if you see an orange light blinking on your monitor.   If your monitor falls off in less than 4 days contact our Monitor department at [phone].  If your monitor becomes loose or falls off after 4 days call [employer] at [phone] for suggestions on securing your monitor.    Follow-Up: 2 months  Any Other Special Instructions Will Be Listed Below (If Applicable).  If you need a refill on your cardiac medications before your next appointment, please call your pharmacy.

## 2022-11-25 ENCOUNTER — Telehealth: Payer: Self-pay | Admitting: Medical

## 2022-11-25 NOTE — Telephone Encounter (Signed)
Patient's wife called to let us know that her husband removed the monitor that Cadence ordered for him.  She states it became itchy and he broke out in a rash with blisters.  He called the company and they told him to mail it back and give [place] a call to let us know.

## 2022-11-25 NOTE — Telephone Encounter (Signed)
Noted, I will FYI provider

## 2022-11-27 DIAGNOSIS — G5603 Carpal tunnel syndrome, bilateral upper limbs: Secondary | ICD-10-CM | POA: Diagnosis not present

## 2022-11-27 DIAGNOSIS — L2081 Atopic neurodermatitis: Secondary | ICD-10-CM | POA: Diagnosis not present

## 2022-11-27 DIAGNOSIS — R202 Paresthesia of skin: Secondary | ICD-10-CM | POA: Diagnosis not present

## 2022-11-27 DIAGNOSIS — G4700 Insomnia, unspecified: Secondary | ICD-10-CM | POA: Diagnosis not present

## 2022-11-27 DIAGNOSIS — G629 Polyneuropathy, unspecified: Secondary | ICD-10-CM | POA: Diagnosis not present

## 2022-11-28 ENCOUNTER — Encounter: Payer: Self-pay | Admitting: Pulmonary Disease

## 2022-11-28 DIAGNOSIS — R001 Bradycardia, unspecified: Secondary | ICD-10-CM | POA: Diagnosis not present

## 2022-12-02 ENCOUNTER — Telehealth: Payer: Self-pay | Admitting: Cardiology

## 2022-12-02 DIAGNOSIS — R001 Bradycardia, unspecified: Secondary | ICD-10-CM

## 2022-12-02 NOTE — Telephone Encounter (Signed)
Patient's wife is returning call to discuss monitor results.

## 2022-12-02 NOTE — Telephone Encounter (Signed)
-----   Message from Nurse Kathrene Alu sent at 12/02/2022  4:01 PM EDT -----  ----- Message ----- From: Marianne Sofia, PA-C Sent: 12/02/2022   3:25 PM EDT To: Parke Poisson, RN  Heart monitor showed normal rhythm with known first degree AV block.PACs.I would like to refer to EP for bradycardia, just to be safe.

## 2022-12-02 NOTE — Telephone Encounter (Signed)
Spoke to pt's wife (ok per pt) who verbalized understanding of results. PCP copied.

## 2022-12-09 ENCOUNTER — Ambulatory Visit: Payer: Medicare Other | Admitting: Urology

## 2022-12-24 ENCOUNTER — Other Ambulatory Visit (HOSPITAL_COMMUNITY): Payer: Medicare Other

## 2022-12-25 ENCOUNTER — Ambulatory Visit (HOSPITAL_COMMUNITY)
Admission: RE | Admit: 2022-12-25 | Discharge: 2022-12-25 | Disposition: A | Discharge status: Home / Self Care | Payer: Medicare Other | Source: Ambulatory Visit | Attending: Medical | Admitting: Medical

## 2022-12-25 DIAGNOSIS — R011 Cardiac murmur, unspecified: Secondary | ICD-10-CM | POA: Diagnosis not present

## 2022-12-25 LAB — ECHOCARDIOGRAM COMPLETE
Area-P 1/2: 2.2 cm2
S' Lateral: 3.1 cm

## 2022-12-25 NOTE — Progress Notes (Signed)
*  PRELIMINARY RESULTS* Echocardiogram 2D Echocardiogram has been performed.  Stacey Drain 12/25/2022, 10:14 AM

## 2022-12-30 DIAGNOSIS — R202 Paresthesia of skin: Secondary | ICD-10-CM | POA: Diagnosis not present

## 2022-12-30 DIAGNOSIS — M129 Arthropathy, unspecified: Secondary | ICD-10-CM | POA: Diagnosis not present

## 2022-12-30 DIAGNOSIS — G5603 Carpal tunnel syndrome, bilateral upper limbs: Secondary | ICD-10-CM | POA: Diagnosis not present

## 2022-12-30 DIAGNOSIS — D519 Vitamin B12 deficiency anemia, unspecified: Secondary | ICD-10-CM | POA: Diagnosis not present

## 2023-01-17 ENCOUNTER — Ambulatory Visit: Payer: Medicare Other | Attending: Internal Medicine | Admitting: Internal Medicine

## 2023-01-17 ENCOUNTER — Encounter: Payer: Self-pay | Admitting: Internal Medicine

## 2023-01-17 VITALS — BP 144/82 | HR 65 | Ht 70.0 in | Wt 195.2 lb

## 2023-01-17 DIAGNOSIS — I495 Sick sinus syndrome: Secondary | ICD-10-CM | POA: Insufficient documentation

## 2023-01-17 NOTE — Patient Instructions (Signed)
Medication Instructions:  Your physician recommends that you continue on your current medications as directed. Please refer to the Current Medication list given to you today.  *If you need a refill on your cardiac medications before your next appointment, please call your pharmacy*   Lab Work: None If you have labs (blood work) drawn today and your tests are completely normal, you will receive your results only by: MyChart Message (if you have MyChart) OR A paper copy in the mail If you have any lab test that is abnormal or we need to change your treatment, we will call you to review the results.   Testing/Procedures: None   Follow-Up: At [facility], you and your health needs are our priority.  As part of our continuing mission to provide you with exceptional heart care, we have created designated Provider Care Teams.  These Care Teams include your primary Cardiologist (physician) and Advanced Practice Providers (APPs -  Physician Assistants and Nurse Practitioners) who all work together to provide you with the care you need, when you need it.  We recommend signing up for the patient portal called "MyChart".  Sign up information is provided on this After Visit Summary.  MyChart is used to connect with patients for Virtual Visits (Telemedicine).  Patients are able to view lab/test results, encounter notes, upcoming appointments, etc.  Non-urgent messages can be sent to your provider as well.   To learn more about what you can do with MyChart, go to [email or web address].    Your next appointment:    Follow up as needed.  Provider:   Lewayne Bunting, MD    Other Instructions

## 2023-01-17 NOTE — Progress Notes (Signed)
HPI Mr. Timothy Tran is referred by Cadence Furth for evaluation of sinus bradycardia. The patient has a h/o CAD, and was noted to be bradycardic back in September. He wore a cardiac monitor which showed SB. No long pauses except at night while sleeping. No symptoms. He denies chest pain or sob. He is bothered by neuropathy. Allergies  Allergen Reactions   Gabapentin      Current Outpatient Medications  Medication Sig Dispense Refill   amitriptyline (ELAVIL) 25 MG tablet Take by mouth.     amLODipine (NORVASC) 5 MG tablet Take 1 tablet (5 mg total) by mouth daily. 90 tablet 3   ascorbic acid (VITAMIN C) 500 MG tablet Take 500 mg by mouth daily.     aspirin EC 81 MG tablet Take 81 mg by mouth daily. Swallow whole.     atorvastatin (LIPITOR) 80 MG tablet Take 1 tablet (80 mg total) by mouth daily. 90 tablet 3   Cholecalciferol (VITAMIN D-3 PO) Take 2,000 Units by mouth daily.      Cyanocobalamin (VITAMIN B-12 PO) Take by mouth.     finasteride (PROSCAR) 5 MG tablet Take 1 tablet (5 mg total) by mouth daily. 90 tablet 3   ibuprofen (ADVIL) 800 MG tablet Take 1 tablet (800 mg total) by mouth every 8 (eight) hours as needed. 90 tablet 1   losartan (COZAAR) 50 MG tablet TAKE 1 TABLET TWICE A DAY 180 tablet 3   nitroGLYCERIN (NITROSTAT) 0.4 MG SL tablet Place 1 tablet under the tongue every 5 (five) minutes x 3 doses as needed for severe pain. If no relief after 2 nd dose, proceed to ED for an evaluation or call 911     omeprazole (PRILOSEC) 20 MG capsule Take 20 mg by mouth 2 (two) times daily before a meal.      pregabalin (LYRICA) 75 MG capsule Take 75 mg by mouth 2 (two) times daily.     tamsulosin (FLOMAX) 0.4 MG CAPS capsule TAKE 1 CAPSULE TWICE DAILY 180 capsule 3   zinc gluconate 50 MG tablet Take 50 mg by mouth daily.     No current facility-administered medications for this visit.     Past Medical History:  Diagnosis Date   CAD (coronary artery disease)    Status post BMS to  LAD and ramus intermedius 2008 - Dr. Nadara Eaton   Dyslipidemia    OSA on CPAP     ROS:   All systems reviewed and negative except as noted in the HPI.   Past Surgical History:  Procedure Laterality Date   CATARACT EXTRACTION W/PHACO Left 07/23/2019   Procedure: CATARACT EXTRACTION PHACO AND INTRAOCULAR LENS PLACEMENT (IOC);  Surgeon: Fabio Pierce, MD;  Location: [facility];  Service: Ophthalmology;  Laterality: Left;  CDE: 17.80   CATARACT EXTRACTION W/PHACO Right 08/06/2019   Procedure: CATARACT EXTRACTION PHACO AND INTRAOCULAR LENS PLACEMENT RIGHT EYE;  Surgeon: Fabio Pierce, MD;  Location: [facility];  Service: Ophthalmology;  Laterality: Right;  CDE: 9.77   CORONARY ANGIOPLASTY WITH STENT PLACEMENT  09/16/2006   stenting of the prox. LAD and ramus intermediate coronary artery   NM MYOCAR PERF WALL MOTION  06/02/2008   mild, small perfusion defect in the basal inferior,mid inferior & apicl inferior regions. Abnormal study. Results unchanged from previous study.  Low risk scan.   US ECHOCARDIOGRAPHY  05/20/2008   mild MR,TR and trace PI.     Family History  Problem Relation Age of Onset   Hyperlipidemia Mother  Hyperlipidemia Sister    Hyperlipidemia Brother      Social History   Socioeconomic History   Marital status: Married    Spouse name: Not on file   Number of children: Not on file   Years of education: Not on file   Highest education level: Not on file  Occupational History   Not on file  Tobacco Use   Smoking status: Former    Current packs/day: 0.00    Types: Cigarettes    Quit date: 03/17/1980    Years since quitting: 42.8   Smokeless tobacco: Never  Vaping Use   Vaping status: Never Used  Substance and Sexual Activity   Alcohol use: No   Drug use: No   Sexual activity: Not on file  Other Topics Concern   Not on file  Social History Narrative   Not on file   Social Determinants of Health   Financial Resource Strain: Not on file  Food Insecurity: Not on  file  Transportation Needs: Not on file  Physical Activity: Not on file  Stress: Not on file  Social Connections: Not on file  Intimate Partner Violence: Not on file     BP (!) 144/82 (BP Location: Left Arm, Patient Position: Sitting, Cuff Size: Normal)   Pulse 65   Ht 5\' 10"  (1.778 m)   Wt 195 lb 3.2 oz (88.5 kg)   SpO2 96%   BMI 28.01 kg/m   Physical Exam:  Well appearing NAD HEENT: Unremarkable Neck:  No JVD, no thyromegally Lymphatics:  No adenopathy Back:  No CVA tenderness Lungs:  Clear with no wheezes HEART:  Regular rate rhythm, no murmurs, no rubs, no clicks Abd:  soft, positive bowel sounds, no organomegally, no rebound, no guarding Ext:  2 plus pulses, no edema, no cyanosis, no clubbing Skin:  No rashes no nodules Neuro:  CN II through XII intact, motor grossly intact  Assess/Plan: Sinus node dysfunction - he is asymptomatic. I have discussed the symptoms he might experience if his heart goes too slow. He will call us if this is the case. We do not pace people without symptoms and with nocturnal bradycardia. CAD - he is asymptomatic. Continue current meds.  Sharlot Gowda Arien Benincasa,MD

## 2023-02-05 ENCOUNTER — Encounter: Payer: Self-pay | Admitting: Orthopaedic Surgery

## 2023-02-05 ENCOUNTER — Ambulatory Visit: Payer: Medicare Other | Admitting: Orthopaedic Surgery

## 2023-02-05 DIAGNOSIS — G8929 Other chronic pain: Secondary | ICD-10-CM | POA: Diagnosis not present

## 2023-02-05 DIAGNOSIS — M25561 Pain in right knee: Secondary | ICD-10-CM | POA: Diagnosis not present

## 2023-02-05 MED ORDER — METHYLPREDNISOLONE ACETATE 40 MG/ML IJ SUSP
40.0000 mg | Freq: Once | INTRAMUSCULAR | Status: AC
  Administered 2023-02-05: 40 mg via INTRA_ARTICULAR

## 2023-02-05 NOTE — Addendum Note (Signed)
Addended by: Michaele Offer on: 02/05/2023 03:26 PM   Modules accepted: Orders

## 2023-02-05 NOTE — Progress Notes (Signed)
My knee hurts again.  He has recurrent pain in the right knee.  He says he hurt it about two weeks ago in a misstep.  He has no swelling, no giving way, but just feeling pain more medially.  He has no redness.  He has good days and bad days.  Right knee has ROM 0 to 110, slight crepitus, no effusion, stable, no limp, NV intact,  no distal edema is present.  Encounter Diagnosis  Name Primary?   Chronic pain of right knee Yes   PROCEDURE NOTE:  The patient requests injections of the right knee , verbal consent was obtained.  The right knee was prepped appropriately after time out was performed.   Sterile technique was observed and injection of 1 cc of DepoMedrol 40mg  with several cc's of plain xylocaine. Anesthesia was provided by ethyl chloride and a 20-gauge needle was used to inject the knee area. The injection was tolerated well.  A band aid dressing was applied.  The patient was advised to apply ice later today and tomorrow to the injection sight as needed.  Return in two weeks.  If it gets worse, call.  I did not do X-rays today as he was walking fairly well and exam was good.  He may need X-rays and MRI if pain gets worse.  Call if any problem.  Precautions discussed.  Electronically Signed Darreld Mclean, MD 11/20/20241:54 PM

## 2023-02-06 ENCOUNTER — Ambulatory Visit: Payer: Medicare Other | Admitting: Internal Medicine

## 2023-02-07 ENCOUNTER — Ambulatory Visit: Payer: Medicare Other | Attending: Cardiology | Admitting: Cardiology

## 2023-02-07 ENCOUNTER — Encounter: Payer: Self-pay | Admitting: Cardiology

## 2023-02-07 VITALS — BP 132/86 | HR 62 | Ht 70.0 in | Wt 197.4 lb

## 2023-02-07 DIAGNOSIS — E782 Mixed hyperlipidemia: Secondary | ICD-10-CM | POA: Diagnosis not present

## 2023-02-07 DIAGNOSIS — I25119 Atherosclerotic heart disease of native coronary artery with unspecified angina pectoris: Secondary | ICD-10-CM | POA: Diagnosis not present

## 2023-02-07 DIAGNOSIS — I10 Essential (primary) hypertension: Secondary | ICD-10-CM | POA: Diagnosis not present

## 2023-02-07 DIAGNOSIS — I495 Sick sinus syndrome: Secondary | ICD-10-CM | POA: Diagnosis not present

## 2023-02-07 NOTE — Progress Notes (Signed)
Cardiology Office Note  Date: 02/07/2023   ID: Timothy Tran, DOB 13-Mar-1941, MRN [id number]  History of Present Illness: Timothy Tran is an 82 y.o. male last seen in September by Ms. Lorna Few, I reviewed the note.  He is here with his wife for a follow-up visit.  He does not report any angina or nitroglycerin use, stable NYHA class II dyspnea.  No palpitations or sudden syncope.  I reviewed his medications.  Current cardiac regimen includes aspirin, Norvasc, Lipitor, Cozaar, and as needed nitroglycerin.  I reviewed his interval testing including echocardiogram and cardiac monitor noted below.  He had a visit with Dr. Ladona Ridgel earlier in the month for evaluation of bradycardia.  Physical Exam: VS:  BP 132/86   Pulse 62   Ht 5\' 10"  (1.778 m)   Wt 197 lb 6.4 oz (89.5 kg)   SpO2 95%   BMI 28.32 kg/m , BMI Body mass index is 28.32 kg/m.  Wt Readings from Last 3 Encounters:  02/07/23 197 lb 6.4 oz (89.5 kg)  01/17/23 195 lb 3.2 oz (88.5 kg)  11/21/22 190 lb (86.2 kg)    General: Patient appears comfortable at rest. HEENT: Conjunctiva and lids normal. Neck: Supple, no elevated JVP or carotid bruits. Lungs: Clear to auscultation, nonlabored breathing at rest. Cardiac: Regular rate and rhythm, no S3, 1/6 systolic murmur. Extremities: No pitting edema.  ECG:  An ECG dated 11/21/2022 was personally reviewed today and demonstrated:  Sinus bradycardia with prolonged PR interval.  Labwork:  June 2024: BUN 14, creatinine 0.96, potassium 4.1, AST 21, ALT 17, cholesterol 134, triglycerides 98, HDL 50, LDL 66, hemoglobin A1c 5.6%, hemoglobin 15.3, platelets 132  Other Studies Reviewed Today:  Cardiac monitor September 2024: ZIO monitor reviewed.  1 day, 4 hours analyzed.   Predominant rhythm is sinus with prolonged PR interval, heart rate ranging from 36 bpm up to 140 bpm and average heart rate 63 bpm. There were occasional PACs representing 3.3% total beats with otherwise rare  atrial couplets. There were occasional PVCs representing 3.1% total beats with otherwise rare ventricular couplets and limited episodes of ventricular bigeminy and trigeminy. Two very brief episodes of SVT were noted, the longest of which was only 6 beats. No sustained arrhythmias, pauses, or heart block.  Echocardiogram 12/25/2022:  1. Left ventricular ejection fraction, by estimation, is 60 to 65%. The  left ventricle has normal function. The left ventricle has no regional  wall motion abnormalities. Left ventricular diastolic parameters are  consistent with Grade I diastolic  dysfunction (impaired relaxation).   2. Right ventricular systolic function is normal. The right ventricular  size is normal. Tricuspid regurgitation signal is inadequate for assessing  PA pressure.   3. Left atrial size was moderately dilated.   4. The mitral valve is normal in structure. Trivial mitral valve  regurgitation. No evidence of mitral stenosis.   5. The aortic valve is tricuspid. Aortic valve regurgitation is not  visualized. No aortic stenosis is present.   6. Aortic dilatation noted. There is mild dilatation of the aortic root,  measuring 41 mm.   7. The inferior vena cava is normal in size with greater than 50%  respiratory variability, suggesting right atrial pressure of 3 mmHg.   Assessment and Plan:  1.  CAD status post BMS to the LAD and ramus intermedius in 2008.  LVEF 60 to 65% by echocardiogram in October of this year.  He does not report any active angina or nitroglycerin use.  Continue observation on aspirin and Lipitor.  2.  Mixed hyperlipidemia.  LDL 66 in June.  Continue Lipitor.  3.  Primary hypertension.  Blood pressure reasonable today.  Continue Norvasc and Cozaar.  4.  Sinus node dysfunction, following clinically at this point.  He did have a visit with Dr. Ladona Ridgel in early November, I reviewed the note.  Currently asymptomatic and not on any AV nodal blockers.  Disposition:   Follow up  6 months.  Signed, Jonelle Sidle, M.D., F.A.C.C. [facility]

## 2023-02-07 NOTE — Patient Instructions (Signed)
Medication Instructions:  Your physician recommends that you continue on your current medications as directed. Please refer to the Current Medication list given to you today.   Labwork: None today  Testing/Procedures: None today  Follow-Up: 6 months  Any Other Special Instructions Will Be Listed Below (If Applicable).  If you need a refill on your cardiac medications before your next appointment, please call your pharmacy.  

## 2023-02-19 ENCOUNTER — Other Ambulatory Visit (INDEPENDENT_AMBULATORY_CARE_PROVIDER_SITE_OTHER): Payer: Self-pay

## 2023-02-19 ENCOUNTER — Ambulatory Visit: Payer: Medicare Other | Admitting: Orthopedic Surgery

## 2023-02-19 VITALS — BP 166/99 | HR 97 | Ht 70.0 in | Wt 195.0 lb

## 2023-02-19 DIAGNOSIS — M25561 Pain in right knee: Secondary | ICD-10-CM

## 2023-02-19 DIAGNOSIS — M25461 Effusion, right knee: Secondary | ICD-10-CM | POA: Diagnosis not present

## 2023-02-19 DIAGNOSIS — M1711 Unilateral primary osteoarthritis, right knee: Secondary | ICD-10-CM | POA: Diagnosis not present

## 2023-02-19 DIAGNOSIS — G8929 Other chronic pain: Secondary | ICD-10-CM

## 2023-02-19 DIAGNOSIS — M23321 Other meniscus derangements, posterior horn of medial meniscus, right knee: Secondary | ICD-10-CM

## 2023-02-19 MED ORDER — TRAMADOL-ACETAMINOPHEN 37.5-325 MG PO TABS: 1.0000 | ORAL_TABLET | ORAL | 5 refills | Status: DC | PRN

## 2023-02-19 MED ORDER — IBUPROFEN 800 MG PO TABS: 800.0000 mg | ORAL_TABLET | Freq: Three times a day (TID) | ORAL | 1 refills | Status: DC | PRN

## 2023-02-19 NOTE — Patient Instructions (Signed)
Cal [phone] to schedule MRI while awaiting APPROVAL from [employer]

## 2023-02-19 NOTE — Progress Notes (Signed)
Office Visit Note   Patient: Timothy Tran           Date of Birth: 1941-01-08           MRN: [id number] Visit Date: 02/19/2023 Requested by: Benita Stabile, MD [address] [place],  [place] [address] PCP: Benita Stabile, MD   Assessment & Plan:   Encounter Diagnoses  Name Primary?   Chronic pain of right knee    Acute pain of right knee Yes   Effusion, right knee    Derangement of posterior horn of medial meniscus of right knee     Meds ordered this encounter  Medications   traMADol-acetaminophen (ULTRACET) 37.5-325 MG tablet    Sig: Take 1 tablet by mouth every 4 (four) hours as needed.    Dispense:  90 tablet    Refill:  5   ibuprofen (ADVIL) 800 MG tablet    Sig: Take 1 tablet (800 mg total) by mouth every 8 (eight) hours as needed.    Dispense:  90 tablet    Refill:  1    [name] has recurrent pain right knee now over a year he has had cortisone shot aspiration injection actually 2 shots in the knee as well as over-the-counter Advil pain has become severe now he cannot walk  Recommend MRI of the right knee and medication for pain and swelling  Meds ordered this encounter  Medications   traMADol-acetaminophen (ULTRACET) 37.5-325 MG tablet    Sig: Take 1 tablet by mouth every 4 (four) hours as needed.    Dispense:  90 tablet    Refill:  5   ibuprofen (ADVIL) 800 MG tablet    Sig: Take 1 tablet (800 mg total) by mouth every 8 (eight) hours as needed.    Dispense:  90 tablet    Refill:  1      Subjective: Chief Complaint  Patient presents with   Knee Pain    Right knee pain     HPI: Timothy's back after receiving cortisone injection from Dr. Hilda Lias I had seen him back in 2023 and he had some right knee discomfort and did well with aspiration injection  However on this occasion he has not improved after the injection that he had 2 weeks ago  His wife is very concerned and says something has to be done  His symptoms are pain medial and posterior knee  relieved by rest aggravated by ambulating and walking.  He says he had no new injury his symptoms just came on suddenly after working in the yard               ROS: Normal   Images personally read and my interpretation : Below  Visit Diagnoses:  1. Acute pain of right knee   2. Chronic pain of right knee   3. Effusion, right knee   4. Derangement of posterior horn of medial meniscus of right knee      Follow-Up Instructions: Return for MRI RESULTS, DR WIL CALL RESUTS TO YOU.    Objective: Vital Signs: BP (!) 166/99   Pulse 97   Ht 5\' 10"  (1.778 m)   Wt 195 lb (88.5 kg)   BMI 27.98 kg/m   Physical Exam Vitals and nursing note reviewed.  Constitutional:      Appearance: Normal appearance.  HENT:     Head: Normocephalic and atraumatic.  Eyes:     General: No scleral icterus.  Right eye: No discharge.        Left eye: No discharge.     Extraocular Movements: Extraocular movements intact.     Conjunctiva/sclera: Conjunctivae normal.     Pupils: Pupils are equal, round, and reactive to light.  Cardiovascular:     Rate and Rhythm: Normal rate.     Pulses: Normal pulses.  Musculoskeletal:     Right knee: Effusion present.     Instability Tests: Medial McMurray test positive.  Skin:    General: Skin is warm and dry.     Capillary Refill: Capillary refill takes less than 2 seconds.  Neurological:     General: No focal deficit present.     Mental Status: He is alert and oriented to person, place, and time.     Gait: Gait abnormal.  Psychiatric:        Mood and Affect: Mood normal.        Behavior: Behavior normal.        Thought Content: Thought content normal.        Judgment: Judgment normal.      Right Knee Exam   Tenderness  Right knee tenderness location: Proximal medial tibia and medial joint line mild lateral patellofemoral joint.  Range of Motion  Extension:  normal  Flexion:  120 abnormal   Tests  McMurray:  Medial - positive  Drawer:   Anterior - negative    Posterior - negative  Other  Erythema: absent Scars: absent Sensation: normal Pulse: present Swelling: none Effusion: effusion present   Left Knee Exam   Range of Motion  Extension:  normal  Flexion:  130        Specialty Comments:  No specialty comments available.  Imaging: DG Knee AP/LAT W/Sunrise Right  Result Date: 02/19/2023 Right knee pain Recurrent pain right knee no trauma Primarily medial knee pain X-rays show mild symmetric joint space narrowing grade 2 arthritis right knee     PMFS History: Patient Active Problem List   Diagnosis Date Noted   Neuropathy 09/04/2022   Leukopenia 09/03/2022   Atherosclerosis of coronary artery without angina pectoris 02/28/2022   Proteinuria 08/29/2021   Pruritic disorder 03/01/2021   Urinary incontinence 03/01/2021   Essential hypertension 08/30/2020   Thrombocytopenic disorder (HCC) 08/30/2020   Diabetes mellitus (HCC) 08/23/2020   Gastroesophageal reflux disease without esophagitis 08/23/2020   Hereditary peripheral neuropathy 08/23/2020   Insomnia 08/23/2020   Coronary artery disease 07/05/2013   HYPERLIPIDEMIA 12/21/2007   OBSTRUCTIVE SLEEP APNEA 12/21/2007   Cardiac dysrhythmia 12/21/2007   Past Medical History:  Diagnosis Date   CAD (coronary artery disease)    Status post BMS to LAD and ramus intermedius 2008 - Dr. Nadara Eaton   Dyslipidemia    OSA on CPAP     Family History  Problem Relation Age of Onset   Hyperlipidemia Mother    Hyperlipidemia Sister    Hyperlipidemia Brother     Past Surgical History:  Procedure Laterality Date   CATARACT EXTRACTION W/PHACO Left 07/23/2019   Procedure: CATARACT EXTRACTION PHACO AND INTRAOCULAR LENS PLACEMENT (IOC);  Surgeon: Fabio Pierce, MD;  Location: [facility];  Service: Ophthalmology;  Laterality: Left;  CDE: 17.80   CATARACT EXTRACTION W/PHACO Right 08/06/2019   Procedure: CATARACT EXTRACTION PHACO AND INTRAOCULAR LENS PLACEMENT RIGHT EYE;   Surgeon: Fabio Pierce, MD;  Location: [facility];  Service: Ophthalmology;  Laterality: Right;  CDE: 9.77   CORONARY ANGIOPLASTY WITH STENT PLACEMENT  09/16/2006   stenting of the prox. LAD  and ramus intermediate coronary artery   NM MYOCAR PERF WALL MOTION  06/02/2008   mild, small perfusion defect in the basal inferior,mid inferior & apicl inferior regions. Abnormal study. Results unchanged from previous study.  Low risk scan.   US ECHOCARDIOGRAPHY  05/20/2008   mild MR,TR and trace PI.   Social History   Occupational History   Not on file  Tobacco Use   Smoking status: Former    Current packs/day: 0.00    Types: Cigarettes    Quit date: 03/17/1980    Years since quitting: 42.9   Smokeless tobacco: Never  Vaping Use   Vaping status: Never Used  Substance and Sexual Activity   Alcohol use: No   Drug use: No   Sexual activity: Not on file

## 2023-02-27 ENCOUNTER — Ambulatory Visit (HOSPITAL_COMMUNITY)
Admission: RE | Admit: 2023-02-27 | Discharge: 2023-02-27 | Disposition: A | Discharge status: Home / Self Care | Payer: Medicare Other | Source: Ambulatory Visit | Attending: Orthopedic Surgery | Admitting: Orthopedic Surgery

## 2023-02-27 DIAGNOSIS — M25561 Pain in right knee: Secondary | ICD-10-CM | POA: Insufficient documentation

## 2023-02-27 DIAGNOSIS — M25461 Effusion, right knee: Secondary | ICD-10-CM | POA: Diagnosis not present

## 2023-02-27 DIAGNOSIS — G8929 Other chronic pain: Secondary | ICD-10-CM | POA: Insufficient documentation

## 2023-02-27 DIAGNOSIS — S83281A Other tear of lateral meniscus, current injury, right knee, initial encounter: Secondary | ICD-10-CM | POA: Diagnosis not present

## 2023-02-27 DIAGNOSIS — S83241A Other tear of medial meniscus, current injury, right knee, initial encounter: Secondary | ICD-10-CM | POA: Diagnosis not present

## 2023-02-27 DIAGNOSIS — M7121 Synovial cyst of popliteal space [Baker], right knee: Secondary | ICD-10-CM | POA: Diagnosis not present

## 2023-03-03 DIAGNOSIS — G4700 Insomnia, unspecified: Secondary | ICD-10-CM | POA: Diagnosis not present

## 2023-03-03 DIAGNOSIS — G5603 Carpal tunnel syndrome, bilateral upper limbs: Secondary | ICD-10-CM | POA: Diagnosis not present

## 2023-03-03 DIAGNOSIS — D519 Vitamin B12 deficiency anemia, unspecified: Secondary | ICD-10-CM | POA: Diagnosis not present

## 2023-03-03 DIAGNOSIS — G629 Polyneuropathy, unspecified: Secondary | ICD-10-CM | POA: Diagnosis not present

## 2023-03-10 ENCOUNTER — Other Ambulatory Visit: Payer: Self-pay | Admitting: Urology

## 2023-03-10 DIAGNOSIS — N138 Other obstructive and reflux uropathy: Secondary | ICD-10-CM

## 2023-03-20 ENCOUNTER — Telehealth: Payer: Self-pay | Admitting: Orthopedic Surgery

## 2023-03-20 DIAGNOSIS — R7301 Impaired fasting glucose: Secondary | ICD-10-CM | POA: Diagnosis not present

## 2023-03-20 DIAGNOSIS — D72819 Decreased white blood cell count, unspecified: Secondary | ICD-10-CM | POA: Diagnosis not present

## 2023-03-20 DIAGNOSIS — N401 Enlarged prostate with lower urinary tract symptoms: Secondary | ICD-10-CM | POA: Diagnosis not present

## 2023-03-20 DIAGNOSIS — Z23 Encounter for immunization: Secondary | ICD-10-CM | POA: Diagnosis not present

## 2023-03-20 DIAGNOSIS — E782 Mixed hyperlipidemia: Secondary | ICD-10-CM | POA: Diagnosis not present

## 2023-03-20 NOTE — Telephone Encounter (Signed)
 His ov notes state Dr will call with results Will you please call him back    IMPRESSION: 1. Suboptimal exam due to motion. 2. Irregular, oblique undersurface tear of the body and posterior horn of the medial meniscus. 3. Radial tear of the posterior horn of the lateral meniscus. 4. Mild tricompartmental degenerative chondrosis with probable chronic calcified loose body anterolaterally. 5. Septated Baker's cyst with possible small internal loose bodies. 6. Diffuse ACL mucoid degeneration without evidence of acute tear. The collateral and cruciate ligaments are intact.

## 2023-03-20 NOTE — Telephone Encounter (Signed)
 Dr. Mort Sawyers pt - patient called to check on MRI results, they resulted on 03/13/23 [phone]

## 2023-03-21 ENCOUNTER — Telehealth (INDEPENDENT_AMBULATORY_CARE_PROVIDER_SITE_OTHER): Payer: Self-pay

## 2023-03-21 NOTE — Telephone Encounter (Signed)
 Patient's wife left vm stating that she was suppose to get a phone call about the results of patient's MRI, and she hasn't receive one yet.  Please call [phone] or [phone]

## 2023-03-21 NOTE — Telephone Encounter (Signed)
 Telephone call with Mrs. Hargens Mr. Doo also on the phone but hard of hearing  Options for arthroscopic meniscectomy versus total knee arthroplasty  Both procedures have inherent risks  If we perform a knee replacement he will have to get preop cardiac clearance and the risk of complications postop would be primarily related to his heart condition  If we do arthroscopic surgery his arthritis could get worse and he could wind up needing a knee replacement anyway  They have opted for the arthroscopic surgery  I will correspond with Dr. Debera to see if he needs any preop clearance for arthroscopy partial medial and lateral meniscectomy

## 2023-03-26 ENCOUNTER — Telehealth: Payer: Self-pay | Admitting: Orthopedic Surgery

## 2023-03-26 DIAGNOSIS — G4700 Insomnia, unspecified: Secondary | ICD-10-CM | POA: Diagnosis not present

## 2023-03-26 DIAGNOSIS — D72819 Decreased white blood cell count, unspecified: Secondary | ICD-10-CM | POA: Diagnosis not present

## 2023-03-26 DIAGNOSIS — R32 Unspecified urinary incontinence: Secondary | ICD-10-CM | POA: Diagnosis not present

## 2023-03-26 DIAGNOSIS — R809 Proteinuria, unspecified: Secondary | ICD-10-CM | POA: Diagnosis not present

## 2023-03-26 DIAGNOSIS — L299 Pruritus, unspecified: Secondary | ICD-10-CM | POA: Diagnosis not present

## 2023-03-26 DIAGNOSIS — N401 Enlarged prostate with lower urinary tract symptoms: Secondary | ICD-10-CM | POA: Diagnosis not present

## 2023-03-26 DIAGNOSIS — D696 Thrombocytopenia, unspecified: Secondary | ICD-10-CM | POA: Diagnosis not present

## 2023-03-26 DIAGNOSIS — K219 Gastro-esophageal reflux disease without esophagitis: Secondary | ICD-10-CM | POA: Diagnosis not present

## 2023-03-26 DIAGNOSIS — I2510 Atherosclerotic heart disease of native coronary artery without angina pectoris: Secondary | ICD-10-CM | POA: Diagnosis not present

## 2023-03-26 DIAGNOSIS — R7301 Impaired fasting glucose: Secondary | ICD-10-CM | POA: Diagnosis not present

## 2023-03-26 DIAGNOSIS — I10 Essential (primary) hypertension: Secondary | ICD-10-CM | POA: Diagnosis not present

## 2023-03-26 DIAGNOSIS — E782 Mixed hyperlipidemia: Secondary | ICD-10-CM | POA: Diagnosis not present

## 2023-03-26 NOTE — Telephone Encounter (Signed)
 I called left message for him to call back about scheduling surgery

## 2023-03-26 NOTE — Telephone Encounter (Signed)
 Dr. Mort Sawyers pt - Timothy Tran, the patient's spouse [phone] lvm stating she was calling Amy about scheduling surgery

## 2023-03-27 ENCOUNTER — Other Ambulatory Visit: Payer: Self-pay | Admitting: Orthopedic Surgery

## 2023-03-27 DIAGNOSIS — M23321 Other meniscus derangements, posterior horn of medial meniscus, right knee: Secondary | ICD-10-CM

## 2023-03-27 DIAGNOSIS — M23300 Other meniscus derangements, unspecified lateral meniscus, right knee: Secondary | ICD-10-CM

## 2023-03-27 DIAGNOSIS — Z01818 Encounter for other preprocedural examination: Secondary | ICD-10-CM

## 2023-03-27 NOTE — Telephone Encounter (Signed)
 Called again scheduled him for Jan 15th

## 2023-03-27 NOTE — Telephone Encounter (Signed)
 Thanks called See other message that is open.

## 2023-03-28 NOTE — Patient Instructions (Signed)
 Timothy Tran  03/28/2023     @PREFPERIOPPHARMACY @   Your procedure is scheduled on 04/02/2023.   Report to [facility] at  0600 A.M.   Call this number if you have problems the morning of surgery:  [phone]  If you experience any cold or flu symptoms such as cough, fever, chills, shortness of breath, etc. between now and your scheduled surgery, please notify us  at the above number.   Remember:  Do not eat after midnight.   You may drink clear liquids until 0330 am on 04/02/2023.    Clear liquids allowed are:                    Water, Juice (No red color; non-citric and without pulp; diabetics please choose diet or no sugar options), Carbonated beverages (diabetics please choose diet or no sugar options), Clear Tea (No creamer, milk, or cream, including half & half and powdered creamer), Black Coffee Only (No creamer, milk or cream, including half & half and powdered creamer), and Clear Sports drink (No red color; diabetics please choose diet or no sugar options)    Take these medicines the morning of surgery with A SIP OF WATER         amitriptyline, amlodipine , finasteride , omeprazole, pregabalin, tamsulosin , tramadol (if needed).     Do not wear jewelry, make-up or nail polish, including gel polish,  artificial nails, or any other type of covering on natural nails (fingers and  toes).  Do not wear lotions, powders, or perfumes, or deodorant.  Do not shave 48 hours prior to surgery.  Men may shave face and neck.  Do not bring valuables to the hospital.  [facility] is not responsible for any belongings or valuables.  Contacts, dentures or bridgework may not be worn into surgery.  Leave your suitcase in the car.  After surgery it may be brought to your room.  For patients admitted to the hospital, discharge time will be determined by your treatment team.  Patients discharged the day of surgery will not be allowed to drive home and must have someone with them for 24  hours.    Special instructions:   DO NOT smoke tobacco or vape for 24 hours before your procedure.  Please read over the following fact sheets that you were given. Coughing and Deep Breathing, Surgical Site Infection Prevention, Anesthesia Post-op Instructions, and Care and Recovery After Surgery        Arthroscopic Knee Ligament Repair, Care After After your knee surgery, it's common to have soreness, swelling, or pain. You may also have some fluid coming from the cuts that were made on your knee (incisions). Follow these instructions at home: Medicines Take over-the-counter and prescription medicines only as told by your health care provider. Ask your provider if the medicine prescribed to you: Requires you to avoid driving or using machinery. Can cause constipation. You may need to take these actions to prevent or treat constipation: Drink enough fluid to keep your pee (urine) pale yellow. Take over-the-counter or prescription medicines. Eat foods that are high in fiber, such as beans, whole grains, and fresh fruits and vegetables. Limit foods that are high in fat and processed sugars, such as fried or sweet foods. If you have a brace: Wear the brace as told by your provider. Remove it only as told by your provider. Check the skin around the brace every day. Tell your provider about any concerns. Loosen the brace  if your toes tingle, become numb, or turn cold and blue. Keep it clean and dry. Ask your provider when it's safe to drive if you have a brace on your knee. Bathing Do not take baths, swim, or use a hot tub until your provider approves. Keep your bandage dry until your provider says that it can be removed. If the brace is not waterproof: Do not let it get wet. Cover it with a watertight covering when you take a bath or shower. Incision care  Follow instructions from your provider about how to take care of your incisions. Make sure you: Wash your hands with soap and  water for at least 20 seconds before and after you change your dressing. If soap and water are not available, use hand sanitizer. Change your dressing as told by your provider. Leave stitches, skin glue, or tape strips in place. These skin closures may need to stay in place for 2 weeks or longer. If tape strip edges start to loosen and curl up, you may trim the loose edges. Do not remove tape strips completely unless your provider tells you to do that. Check your incision areas every day for signs of infection. Check for: Redness. More swelling or pain. Blood or more fluid. Warmth. Pus or a bad smell. Managing pain, stiffness, and swelling  If told, put ice on the affected area. If you have a removable brace, remove it as told by your provider. Put ice in a plastic bag. Place a towel between your skin and the bag. Leave the ice on for 20 minutes, 2-3 times a day. If your skin turns bright red, remove the ice right away to prevent skin damage. The risk of damage is higher if you can't feel pain, heat, or cold. Move your toes often to reduce stiffness and swelling. Raise the injured area above the level of your heart while you are sitting or lying down. Activity Do not use your knee to walk until your provider says that you can. Use crutches or other devices as told by your provider. You will work with a physical therapist to do exercises. This will make your knee move better and get stronger. Your provider will tell you: When you may do exercises that move parts of your body. These are called motion exercises. When you may start to ride a stationary bike and other gentle exercises. When you may start to do harder exercises, such as jogging. You may have to avoid lifting. Ask your provider how much you can safely lift. Return to your normal activities as told by your provider. Ask your provider what activities are safe for you. General instructions Do not use any products that contain  nicotine or tobacco. These products include cigarettes, chewing tobacco, and vaping devices, such as e-cigarettes. If you need help quitting, ask your provider. Wear compression stockings as told by your provider. These stockings help to prevent blood clots and reduce swelling in your leg. Keep all follow-up visits. Your provider will check that your knee is healing well. Contact a health care provider if: You have signs of infection in the cuts that were made on your knee. Signs include swelling, redness, warmth, pus, or a bad smell. You have a fever or chills. You have pain that does not get better with medicine. The cuts in your knee open up. Get help right away if: You have trouble breathing. You have chest pain. You have worse pain or swelling in your calf or at the  back of your knee. Your knee, foot, ankle, or toes tingle or become numb. Your foot or toes look darker than normal or are cooler than normal. These symptoms may be an emergency. Get help right away. Call 911. Do not wait to see if the symptoms will go away. Do not drive yourself to the hospital. This information is not intended to replace advice given to you by your health care provider. Make sure you discuss any questions you have with your health care provider. Document Revised: 05/29/2022 Document Reviewed: 05/29/2022 Elsevier Patient Education  2024 [employer] General Anesthesia, Adult, Care After The following information offers guidance on how to care for yourself after your procedure. Your health care provider may also give you more specific instructions. If you have problems or questions, contact your health care provider. What can I expect after the procedure? After the procedure, it is common for people to: Have pain or discomfort at the IV site. Have nausea or vomiting. Have a sore throat or hoarseness. Have trouble concentrating. Feel cold or chills. Feel weak, sleepy, or tired (fatigue). Have soreness  and body aches. These can affect parts of the body that were not involved in surgery. Follow these instructions at home: For the time period you were told by your health care provider:  Rest. Do not participate in activities where you could fall or become injured. Do not drive or use machinery. Do not drink alcohol. Do not take sleeping pills or medicines that cause drowsiness. Do not make important decisions or sign legal documents. Do not take care of children on your own. General instructions Drink enough fluid to keep your urine pale yellow. If you have sleep apnea, surgery and certain medicines can increase your risk for breathing problems. Follow instructions from your health care provider about wearing your sleep device: Anytime you are sleeping, including during daytime naps. While taking prescription pain medicines, sleeping medicines, or medicines that make you drowsy. Return to your normal activities as told by your health care provider. Ask your health care provider what activities are safe for you. Take over-the-counter and prescription medicines only as told by your health care provider. Do not use any products that contain nicotine or tobacco. These products include cigarettes, chewing tobacco, and vaping devices, such as e-cigarettes. These can delay incision healing after surgery. If you need help quitting, ask your health care provider. Contact a health care provider if: You have nausea or vomiting that does not get better with medicine. You vomit every time you eat or drink. You have pain that does not get better with medicine. You cannot urinate or have bloody urine. You develop a skin rash. You have a fever. Get help right away if: You have trouble breathing. You have chest pain. You vomit blood. These symptoms may be an emergency. Get help right away. Call 911. Do not wait to see if the symptoms will go away. Do not drive yourself to the hospital. Summary After  the procedure, it is common to have a sore throat, hoarseness, nausea, vomiting, or to feel weak, sleepy, or fatigue. For the time period you were told by your health care provider, do not drive or use machinery. Get help right away if you have difficulty breathing, have chest pain, or vomit blood. These symptoms may be an emergency. This information is not intended to replace advice given to you by your health care provider. Make sure you discuss any questions you have with your health care provider. Document  Revised: 06/01/2021 Document Reviewed: 06/01/2021 Elsevier Patient Education  2024 [employer]. How to Use Chlorhexidine  Before Surgery Chlorhexidine  gluconate (CHG) is a germ-killing (antiseptic) solution that is used to clean the skin. It can get rid of the bacteria that normally live on the skin and can keep them away for about 24 hours. To clean your skin with CHG, you may be given: A CHG solution to use in the shower or as part of a sponge bath. A prepackaged cloth that contains CHG. Cleaning your skin with CHG may help lower the risk for infection: While you are staying in the intensive care unit of the hospital. If you have a vascular access, such as a central line, to provide short-term or long-term access to your veins. If you have a catheter to drain urine from your bladder. If you are on a ventilator. A ventilator is a machine that helps you breathe by moving air in and out of your lungs. After surgery. What are the risks? Risks of using CHG include: A skin reaction. Hearing loss, if CHG gets in your ears and you have a perforated eardrum. Eye injury, if CHG gets in your eyes and is not rinsed out. The CHG product catching fire. Make sure that you avoid smoking and flames after applying CHG to your skin. Do not use CHG: If you have a chlorhexidine  allergy or have previously reacted to chlorhexidine . On babies younger than 31 months of age. How to use CHG solution Use CHG  only as told by your health care provider, and follow the instructions on the label. Use the full amount of CHG as directed. Usually, this is one bottle. During a shower Follow these steps when using CHG solution during a shower (unless your health care provider gives you different instructions): Start the shower. Use your normal soap and shampoo to wash your face and hair. Turn off the shower or move out of the shower stream. Pour the CHG onto a clean washcloth. Do not use any type of brush or rough-edged sponge. Starting at your neck, lather your body down to your toes. Make sure you follow these instructions: If you will be having surgery, pay special attention to the part of your body where you will be having surgery. Scrub this area for at least 1 minute. Do not use CHG on your head or face. If the solution gets into your ears or eyes, rinse them well with water. Avoid your genital area. Avoid any areas of skin that have broken skin, cuts, or scrapes. Scrub your back and under your arms. Make sure to wash skin folds. Let the lather sit on your skin for 1-2 minutes or as long as told by your health care provider. Thoroughly rinse your entire body in the shower. Make sure that all body creases and crevices are rinsed well. Dry off with a clean towel. Do not put any substances on your body afterward--such as powder, lotion, or perfume--unless you are told to do so by your health care provider. Only use lotions that are recommended by the manufacturer. Put on clean clothes or pajamas. If it is the night before your surgery, sleep in clean sheets.  During a sponge bath Follow these steps when using CHG solution during a sponge bath (unless your health care provider gives you different instructions): Use your normal soap and shampoo to wash your face and hair. Pour the CHG onto a clean washcloth. Starting at your neck, lather your body down to your toes.  Make sure you follow these  instructions: If you will be having surgery, pay special attention to the part of your body where you will be having surgery. Scrub this area for at least 1 minute. Do not use CHG on your head or face. If the solution gets into your ears or eyes, rinse them well with water. Avoid your genital area. Avoid any areas of skin that have broken skin, cuts, or scrapes. Scrub your back and under your arms. Make sure to wash skin folds. Let the lather sit on your skin for 1-2 minutes or as long as told by your health care provider. Using a different clean, wet washcloth, thoroughly rinse your entire body. Make sure that all body creases and crevices are rinsed well. Dry off with a clean towel. Do not put any substances on your body afterward--such as powder, lotion, or perfume--unless you are told to do so by your health care provider. Only use lotions that are recommended by the manufacturer. Put on clean clothes or pajamas. If it is the night before your surgery, sleep in clean sheets. How to use CHG prepackaged cloths Only use CHG cloths as told by your health care provider, and follow the instructions on the label. Use the CHG cloth on clean, dry skin. Do not use the CHG cloth on your head or face unless your health care provider tells you to. When washing with the CHG cloth: Avoid your genital area. Avoid any areas of skin that have broken skin, cuts, or scrapes. Before surgery Follow these steps when using a CHG cloth to clean before surgery (unless your health care provider gives you different instructions): Using the CHG cloth, vigorously scrub the part of your body where you will be having surgery. Scrub using a back-and-forth motion for 3 minutes. The area on your body should be completely wet with CHG when you are done scrubbing. Do not rinse. Discard the cloth and let the area air-dry. Do not put any substances on the area afterward, such as powder, lotion, or perfume. Put on clean clothes  or pajamas. If it is the night before your surgery, sleep in clean sheets.  For general bathing Follow these steps when using CHG cloths for general bathing (unless your health care provider gives you different instructions). Use a separate CHG cloth for each area of your body. Make sure you wash between any folds of skin and between your fingers and toes. Wash your body in the following order, switching to a new cloth after each step: The front of your neck, shoulders, and chest. Both of your arms, under your arms, and your hands. Your stomach and groin area, avoiding the genitals. Your right leg and foot. Your left leg and foot. The back of your neck, your back, and your buttocks. Do not rinse. Discard the cloth and let the area air-dry. Do not put any substances on your body afterward--such as powder, lotion, or perfume--unless you are told to do so by your health care provider. Only use lotions that are recommended by the manufacturer. Put on clean clothes or pajamas. Contact a health care provider if: Your skin gets irritated after scrubbing. You have questions about using your solution or cloth. You swallow any chlorhexidine . Call your local poison control center ([phone] in the [place] Get help right away if: Your eyes itch badly, or they become very red or swollen. Your skin itches badly and is red or swollen. Your hearing changes. You have trouble seeing. You have  swelling or tingling in your mouth or throat. You have trouble breathing. These symptoms may represent a serious problem that is an emergency. Do not wait to see if the symptoms will go away. Get medical help right away. Call your local emergency services (911 in the [place] Do not drive yourself to the hospital. Summary Chlorhexidine  gluconate (CHG) is a germ-killing (antiseptic) solution that is used to clean the skin. Cleaning your skin with CHG may help to lower your risk for infection. You may be given CHG to  use for bathing. It may be in a bottle or in a prepackaged cloth to use on your skin. Carefully follow your health care provider's instructions and the instructions on the product label. Do not use CHG if you have a chlorhexidine  allergy. Contact your health care provider if your skin gets irritated after scrubbing. This information is not intended to replace advice given to you by your health care provider. Make sure you discuss any questions you have with your health care provider. Document Revised: 07/02/2021 Document Reviewed: 05/15/2020 Elsevier Patient Education  2023 [employer].

## 2023-03-31 ENCOUNTER — Encounter (HOSPITAL_COMMUNITY): Payer: Self-pay

## 2023-03-31 ENCOUNTER — Other Ambulatory Visit: Payer: Self-pay

## 2023-03-31 ENCOUNTER — Encounter (HOSPITAL_COMMUNITY)
Admission: RE | Admit: 2023-03-31 | Discharge: 2023-03-31 | Disposition: A | Discharge status: Home / Self Care | Payer: Medicare Other | Source: Ambulatory Visit | Attending: Orthopedic Surgery

## 2023-03-31 DIAGNOSIS — Z01818 Encounter for other preprocedural examination: Secondary | ICD-10-CM

## 2023-03-31 DIAGNOSIS — M23300 Other meniscus derangements, unspecified lateral meniscus, right knee: Secondary | ICD-10-CM

## 2023-03-31 DIAGNOSIS — Z01812 Encounter for preprocedural laboratory examination: Secondary | ICD-10-CM | POA: Insufficient documentation

## 2023-03-31 DIAGNOSIS — M23321 Other meniscus derangements, posterior horn of medial meniscus, right knee: Secondary | ICD-10-CM | POA: Insufficient documentation

## 2023-03-31 LAB — BASIC METABOLIC PANEL
Anion gap: 7 (ref 5–15)
BUN: 14 mg/dL (ref 8–23)
CO2: 24 mmol/L (ref 22–32)
Calcium: 8.8 mg/dL — ABNORMAL LOW (ref 8.9–10.3)
Chloride: 106 mmol/L (ref 98–111)
Creatinine, Ser: 0.76 mg/dL (ref 0.61–1.24)
GFR, Estimated: >60 mL/min (ref >60)
Glucose, Bld: 108 mg/dL — ABNORMAL HIGH (ref 70–99)
Potassium: 3.6 mmol/L (ref 3.5–5.1)
Sodium: 137 mmol/L (ref 135–145)

## 2023-03-31 LAB — CBC WITH DIFFERENTIAL/PLATELET
Abs Immature Granulocytes: 0.11 10*3/uL — ABNORMAL HIGH (ref 0.00–0.07)
Basophils Absolute: 0.1 10*3/uL (ref 0.0–0.1)
Basophils Relative: 1 %
Eosinophils Absolute: 0.1 10*3/uL (ref 0.0–0.5)
Eosinophils Relative: 2 %
HCT: 43.9 % (ref 39.0–52.0)
Hemoglobin: 14.9 g/dL (ref 13.0–17.0)
Immature Granulocytes: 3 %
Lymphocytes Relative: 34 %
Lymphs Abs: 1.3 10*3/uL (ref 0.7–4.0)
MCH: 29.2 pg (ref 26.0–34.0)
MCHC: 33.9 g/dL (ref 30.0–36.0)
MCV: 85.9 fL (ref 80.0–100.0)
Monocytes Absolute: 0.4 10*3/uL (ref 0.1–1.0)
Monocytes Relative: 10 %
Neutro Abs: 1.9 10*3/uL (ref 1.7–7.7)
Neutrophils Relative %: 50 %
Platelets: 144 10*3/uL — ABNORMAL LOW (ref 150–400)
RBC: 5.11 MIL/uL (ref 4.22–5.81)
RDW: 13.7 % (ref 11.5–15.5)
WBC: 3.8 10*3/uL — ABNORMAL LOW (ref 4.0–10.5)
nRBC: 0 % (ref 0.0–0.2)

## 2023-03-31 NOTE — Progress Notes (Signed)
PAT visit completed. Pt verbalized understanding on procedure instructions and arrival time.   

## 2023-04-01 NOTE — Anesthesia Preprocedure Evaluation (Addendum)
 Anesthesia Evaluation  Patient identified by MRN, date of birth, ID band Patient awake    Reviewed: Allergy & Precautions, H&P , NPO status , Patient's Chart, lab work & pertinent test results, reviewed documented beta blocker date and time   Airway Mallampati: II  TM Distance: >3 FB Neck ROM: full    Dental  (+) Lower Dentures, Upper Dentures   Pulmonary sleep apnea and Continuous Positive Airway Pressure Ventilation , former smoker   Pulmonary exam normal breath sounds clear to auscultation       Cardiovascular Exercise Tolerance: Good hypertension, + CAD and + Cardiac Stents  Normal cardiovascular exam Rhythm:regular Rate:Normal     Neuro/Psych  Neuromuscular disease  negative psych ROS   GI/Hepatic Neg liver ROS,GERD  ,,  Endo/Other  negative endocrine ROS    Renal/GU negative Renal ROS  negative genitourinary   Musculoskeletal negative musculoskeletal ROS (+)    Abdominal   Peds negative pediatric ROS (+)  Hematology negative hematology ROS (+)   Anesthesia Other Findings   Reproductive/Obstetrics negative OB ROS                             Anesthesia Physical Anesthesia Plan  ASA: 3  Anesthesia Plan: General   Post-op Pain Management: Dilaudid IV   Induction: Intravenous  PONV Risk Score and Plan: 2 and Ondansetron , Dexamethasone  and Midazolam  Airway Management Planned: LMA  Additional Equipment: None  Intra-op Plan:   Post-operative Plan: Extubation in OR  Informed Consent: I have reviewed the patients History and Physical, chart, labs and discussed the procedure including the risks, benefits and alternatives for the proposed anesthesia with the patient or authorized representative who has indicated his/her understanding and acceptance.     Dental Advisory Given  Plan Discussed with: CRNA  Anesthesia Plan Comments:         Anesthesia Quick Evaluation

## 2023-04-01 NOTE — H&P (Signed)
 History and physical for  Outpatient surgery for a right knee arthroscopy with lateral meniscectomy  Diagnosis lateral meniscus tear and osteoarthritis right knee  Subjective:     Chief Complaint  Patient presents with   Knee Pain      Right knee pain        HPI: 83 year old male who was treated nonoperatively by Dr. Brenna for right knee pain back in 2023  He and his wife are concerned that something needs to be done as he continued to have pain and swelling   His symptoms are pain medial and posterior knee relieved by rest aggravated by ambulating and walking.  He says he had no new injury his symptoms just came on suddenly after working in the yard  CLINICAL DATA:  Meniscal injury, knee.  Chronic pain of right knee.   EXAM: MRI OF THE RIGHT KNEE WITHOUT CONTRAST   TECHNIQUE: Multiplanar, multisequence MR imaging of the knee was performed. No intravenous contrast was administered.   COMPARISON:  Radiographs 09/03/2021 and 02/19/2023.   FINDINGS: Technical note: Despite efforts by the technologist and patient, moderate motion artifact is present on today's exam and could not be eliminated. This reduces exam sensitivity and specificity.   MENISCI   Medial meniscus: Suboptimally evaluated due to motion. Irregular, oblique undersurface tear of the body and posterior horn, best seen on the sagittal and coronal images. The meniscal root appears intact. No definite centrally displaced meniscal fragment.   Lateral meniscus: Suboptimally evaluated due to motion. Radial tear of the posterior horn, best seen on the coronal images. No definite centrally displaced meniscal fragment. 7 mm loose body adjacent to the anterior horn (sagittal image 9/19), likely calcified on radiographs.   LIGAMENTS   Cruciates: Diffuse ACL mucoid degeneration without evidence of acute tear. The PCL appears intact.   Collaterals: The medial and lateral collateral ligament complexes are  intact.   CARTILAGE   Patellofemoral: Mild patellofemoral chondral thinning and surface irregularity with mild subchondral cyst formation in the lateral trochlea.   Medial: Mild chondral thinning and surface irregularity. No focal chondral defect.   Lateral: Mild chondral thinning and surface irregularity. No focal chondral defect.   MISCELLANEOUS   Joint: Small joint effusion with synovial irregularity. As above, probable chronic calcified loose body anterolaterally.   Popliteal Fossa: The popliteus muscle and tendon are intact. Septated Baker's cyst measures up to 9.6 cm in length with possible small internal loose bodies.   Extensor Mechanism: The visualized quadriceps and patellar tendons are intact.   Bones:  No acute or significant extra-articular osseous findings.   Other: No other significant periarticular soft tissue findings.   IMPRESSION: 1. Suboptimal exam due to motion. 2. Irregular, oblique undersurface tear of the body and posterior horn of the medial meniscus. 3. Radial tear of the posterior horn of the lateral meniscus. 4. Mild tricompartmental degenerative chondrosis with probable chronic calcified loose body anterolaterally. 5. Septated Baker's cyst with possible small internal loose bodies. 6. Diffuse ACL mucoid degeneration without evidence of acute tear. The collateral and cruciate ligaments are intact.     Electronically Signed   By: Elsie Perone M.D.   On: 03/13/2023 11:11  ROS: Normal     Images personally read and my interpretation : Below   Visit Diagnoses:  1. Acute pain of right knee   2. Chronic pain of right knee   3. Effusion, right knee   4. Derangement of posterior horn of medial meniscus of right knee     Past Medical History:  Diagnosis Date   CAD (coronary artery disease)    Status post BMS to LAD and ramus intermedius 2008 - Dr. Margaretann    Dyslipidemia    OSA on CPAP    Past Surgical History:  Procedure Laterality Date   CATARACT EXTRACTION W/PHACO Left 07/23/2019   Procedure: CATARACT EXTRACTION PHACO AND INTRAOCULAR LENS PLACEMENT (IOC);  Surgeon: Harrie Agent, MD;  Location: [facility];  Service: Ophthalmology;  Laterality: Left;  CDE: 17.80   CATARACT EXTRACTION W/PHACO Right 08/06/2019   Procedure: CATARACT EXTRACTION PHACO AND INTRAOCULAR LENS PLACEMENT RIGHT EYE;  Surgeon: Harrie Agent, MD;  Location: [facility];  Service: Ophthalmology;  Laterality: Right;  CDE: 9.77   CORONARY ANGIOPLASTY WITH STENT PLACEMENT  09/16/2006   stenting of the prox. LAD and ramus intermediate coronary artery   NM MYOCAR PERF WALL MOTION  06/02/2008   mild, small perfusion defect in the basal inferior,mid inferior & apicl inferior regions. Abnormal study. Results unchanged from previous study.  Low risk scan.   US  ECHOCARDIOGRAPHY  05/20/2008   mild MR,TR and trace PI.   Social History   Tobacco Use   Smoking status: Former    Current packs/day: 0.00    Types: Cigarettes    Quit date: 03/17/1980    Years since quitting: 43.0   Smokeless tobacco: Never  Vaping Use   Vaping status: Never Used  Substance Use Topics   Alcohol use: No   Drug use: No   Family History  Problem Relation Age of Onset   Hyperlipidemia Mother    Hyperlipidemia Sister    Hyperlipidemia Brother         Objective: Vital Signs: BP (!) 166/99   Pulse 97   Ht 5' 10 (1.778 m)   Wt 195 lb (88.5 kg)   BMI 27.98 kg/m    Physical Exam Vitals and nursing note reviewed.  Constitutional:      Appearance: Normal appearance.  HENT:     Head: Normocephalic and atraumatic.  Eyes:     General: No scleral icterus.       Right eye: No discharge.        Left eye: No discharge.     Extraocular Movements: Extraocular movements intact.     Conjunctiva/sclera: Conjunctivae normal.     Pupils: Pupils are equal, round, and reactive to light.  Cardiovascular:     Rate  and Rhythm: Normal rate.     Pulses: Normal pulses.  Musculoskeletal:     Right knee: Effusion present.     Instability Tests: Medial McMurray test positive.  Skin:    General: Skin is warm and dry.     Capillary Refill: Capillary refill takes less than 2 seconds.  Neurological:     General: No focal deficit present.     Mental Status: He is alert and oriented to person, place, and time.     Gait: Gait abnormal.  Psychiatric:        Mood and Affect: Mood normal.        Behavior: Behavior normal.        Thought Content: Thought content normal.  Judgment: Judgment normal.          Right Knee Exam    Tenderness  Right knee tenderness location: Proximal medial tibia and medial joint line mild lateral patellofemoral joint.   Range of Motion  Extension:  normal  Flexion:  120 abnormal    Tests  McMurray:  Medial - positive  Drawer:  Anterior - negative    Posterior - negative   Other  Erythema: absent Scars: absent Sensation: normal Pulse: present Swelling: none Effusion: effusion present     Left Knee Exam    Range of Motion  Extension:  normal  Flexion:  130              Specialty Comments:  No specialty comments available.   Imaging: DG Knee AP/LAT W/Sunrise Right   Result Date: 02/19/2023 Right knee pain Recurrent pain right knee no trauma Primarily medial knee pain X-rays show mild symmetric joint space narrowing grade 2 arthritis right knee

## 2023-04-02 ENCOUNTER — Encounter (HOSPITAL_COMMUNITY): Payer: Self-pay | Admitting: Orthopedic Surgery

## 2023-04-02 ENCOUNTER — Ambulatory Visit (HOSPITAL_BASED_OUTPATIENT_CLINIC_OR_DEPARTMENT_OTHER): Payer: Medicare Other | Admitting: Anesthesiology

## 2023-04-02 ENCOUNTER — Ambulatory Visit (HOSPITAL_COMMUNITY): Payer: Self-pay | Admitting: Anesthesiology

## 2023-04-02 ENCOUNTER — Encounter (HOSPITAL_COMMUNITY)
Admission: RE | Disposition: A | Discharge status: Home / Self Care | Payer: Self-pay | Source: Home / Self Care | Attending: Orthopedic Surgery

## 2023-04-02 ENCOUNTER — Ambulatory Visit (HOSPITAL_COMMUNITY)
Admission: RE | Admit: 2023-04-02 | Discharge: 2023-04-02 | Disposition: A | Discharge status: Home / Self Care | Payer: Medicare Other | Attending: Orthopedic Surgery | Admitting: Orthopedic Surgery

## 2023-04-02 DIAGNOSIS — M23221 Derangement of posterior horn of medial meniscus due to old tear or injury, right knee: Secondary | ICD-10-CM | POA: Insufficient documentation

## 2023-04-02 DIAGNOSIS — Z955 Presence of coronary angioplasty implant and graft: Secondary | ICD-10-CM | POA: Diagnosis not present

## 2023-04-02 DIAGNOSIS — S83241A Other tear of medial meniscus, current injury, right knee, initial encounter: Secondary | ICD-10-CM

## 2023-04-02 DIAGNOSIS — S83271A Complex tear of lateral meniscus, current injury, right knee, initial encounter: Secondary | ICD-10-CM | POA: Insufficient documentation

## 2023-04-02 DIAGNOSIS — S83281A Other tear of lateral meniscus, current injury, right knee, initial encounter: Secondary | ICD-10-CM

## 2023-04-02 DIAGNOSIS — G4733 Obstructive sleep apnea (adult) (pediatric): Secondary | ICD-10-CM | POA: Insufficient documentation

## 2023-04-02 DIAGNOSIS — M23321 Other meniscus derangements, posterior horn of medial meniscus, right knee: Secondary | ICD-10-CM | POA: Diagnosis not present

## 2023-04-02 DIAGNOSIS — I2510 Atherosclerotic heart disease of native coronary artery without angina pectoris: Secondary | ICD-10-CM

## 2023-04-02 DIAGNOSIS — M23351 Other meniscus derangements, posterior horn of lateral meniscus, right knee: Secondary | ICD-10-CM

## 2023-04-02 DIAGNOSIS — Z87891 Personal history of nicotine dependence: Secondary | ICD-10-CM | POA: Insufficient documentation

## 2023-04-02 DIAGNOSIS — I10 Essential (primary) hypertension: Secondary | ICD-10-CM

## 2023-04-02 DIAGNOSIS — M25561 Pain in right knee: Secondary | ICD-10-CM

## 2023-04-02 DIAGNOSIS — X58XXXA Exposure to other specified factors, initial encounter: Secondary | ICD-10-CM | POA: Insufficient documentation

## 2023-04-02 DIAGNOSIS — S83231A Complex tear of medial meniscus, current injury, right knee, initial encounter: Secondary | ICD-10-CM | POA: Diagnosis not present

## 2023-04-02 DIAGNOSIS — G4730 Sleep apnea, unspecified: Secondary | ICD-10-CM | POA: Diagnosis not present

## 2023-04-02 HISTORY — PX: KNEE ARTHROSCOPY WITH LATERAL MENISECTOMY: SHX6193

## 2023-04-02 SURGERY — ARTHROSCOPY, KNEE, WITH LATERAL MENISCECTOMY
Anesthesia: General | Site: Knee | Laterality: Right

## 2023-04-02 MED ORDER — ONDANSETRON HCL 4 MG/2ML IJ SOLN
4.0000 mg | Freq: Once | INTRAMUSCULAR | Status: AC | PRN
  Administered 2023-04-02: 4 mg via INTRAVENOUS
  Filled 2023-04-02: qty 2

## 2023-04-02 MED ORDER — SODIUM CHLORIDE 0.9 % IR SOLN
Status: DC | PRN
  Administered 2023-04-02 (×2): 3000 mL

## 2023-04-02 MED ORDER — EPINEPHRINE PF 1 MG/ML IJ SOLN
INTRAMUSCULAR | Status: AC
  Filled 2023-04-02: qty 8

## 2023-04-02 MED ORDER — IBUPROFEN 800 MG PO TABS: 800.0000 mg | ORAL_TABLET | Freq: Three times a day (TID) | ORAL | 1 refills | Status: DC | PRN

## 2023-04-02 MED ORDER — ACETAMINOPHEN 500 MG PO TABS: 500.0000 mg | ORAL_TABLET | Freq: Once | ORAL | Status: DC

## 2023-04-02 MED ORDER — LACTATED RINGERS IV SOLN: INTRAVENOUS | Status: DC

## 2023-04-02 MED ORDER — FENTANYL CITRATE (PF) 100 MCG/2ML IJ SOLN
INTRAMUSCULAR | Status: DC | PRN
  Administered 2023-04-02 (×2): 25 ug via INTRAVENOUS
  Administered 2023-04-02: 50 ug via INTRAVENOUS

## 2023-04-02 MED ORDER — HEPARIN SODIUM (PORCINE) 5000 UNIT/ML IJ SOLN
5000.0000 [IU] | Freq: Once | INTRAMUSCULAR | Status: AC
  Administered 2023-04-02: 5000 [IU] via SUBCUTANEOUS
  Filled 2023-04-02: qty 1

## 2023-04-02 MED ORDER — LIDOCAINE HCL (PF) 2 % IJ SOLN
INTRAMUSCULAR | Status: DC | PRN
  Administered 2023-04-02: 60 mg via INTRADERMAL

## 2023-04-02 MED ORDER — CEFAZOLIN SODIUM-DEXTROSE 2-4 GM/100ML-% IV SOLN
INTRAVENOUS | Status: AC
  Filled 2023-04-02: qty 100

## 2023-04-02 MED ORDER — LACTATED RINGERS IV SOLN
INTRAVENOUS | Status: AC
Start: 2023-04-02 — End: 2023-04-02

## 2023-04-02 MED ORDER — CHLORHEXIDINE GLUCONATE 0.12 % MT SOLN
15.0000 mL | Freq: Once | OROMUCOSAL | Status: AC
  Administered 2023-04-02: 15 mL via OROMUCOSAL
  Filled 2023-04-02: qty 15

## 2023-04-02 MED ORDER — ORAL CARE MOUTH RINSE: 15.0000 mL | Freq: Once | OROMUCOSAL | Status: AC

## 2023-04-02 MED ORDER — METHYLPREDNISOLONE SODIUM SUCC 40 MG IJ SOLR
40.0000 mg | Freq: Once | INTRAMUSCULAR | Status: AC
  Administered 2023-04-02: 40 mg via INTRAVENOUS
  Filled 2023-04-02: qty 1

## 2023-04-02 MED ORDER — ACETAMINOPHEN 160 MG/5ML PO SOLN
960.0000 mg | Freq: Once | ORAL | Status: AC
  Filled 2023-04-02: qty 30

## 2023-04-02 MED ORDER — FENTANYL CITRATE (PF) 100 MCG/2ML IJ SOLN
INTRAMUSCULAR | Status: AC
  Filled 2023-04-02: qty 2

## 2023-04-02 MED ORDER — TRAMADOL HCL 50 MG PO TABS: 50.0000 mg | ORAL_TABLET | Freq: Four times a day (QID) | ORAL | 0 refills | Status: AC | PRN

## 2023-04-02 MED ORDER — CEFAZOLIN SODIUM-DEXTROSE 2-4 GM/100ML-% IV SOLN
2.0000 g | INTRAVENOUS | Status: AC
  Administered 2023-04-02: 2 g via INTRAVENOUS

## 2023-04-02 MED ORDER — ACETAMINOPHEN 500 MG PO TABS
1000.0000 mg | ORAL_TABLET | Freq: Once | ORAL | Status: AC
Start: 2023-04-02 — End: 2023-04-02
  Administered 2023-04-02: 1000 mg via ORAL
  Filled 2023-04-02: qty 2

## 2023-04-02 MED ORDER — OXYCODONE HCL 5 MG/5ML PO SOLN: 5.0000 mg | Freq: Once | ORAL | Status: DC | PRN

## 2023-04-02 MED ORDER — PHENYLEPHRINE 80 MCG/ML (10ML) SYRINGE FOR IV PUSH (FOR BLOOD PRESSURE SUPPORT)
PREFILLED_SYRINGE | INTRAVENOUS | Status: DC | PRN
  Administered 2023-04-02 (×5): 80 ug via INTRAVENOUS

## 2023-04-02 MED ORDER — PROPOFOL 10 MG/ML IV BOLUS
INTRAVENOUS | Status: DC | PRN
  Administered 2023-04-02: 120 mg via INTRAVENOUS

## 2023-04-02 MED ORDER — ASPIRIN 325 MG PO TBEC: 325.0000 mg | DELAYED_RELEASE_TABLET | Freq: Every day | ORAL | 3 refills | Status: AC

## 2023-04-02 MED ORDER — PROPOFOL 10 MG/ML IV BOLUS
INTRAVENOUS | Status: AC
  Filled 2023-04-02: qty 20

## 2023-04-02 MED ORDER — OXYCODONE HCL 5 MG PO TABS: 5.0000 mg | ORAL_TABLET | Freq: Once | ORAL | Status: DC | PRN

## 2023-04-02 MED ORDER — BUPIVACAINE-EPINEPHRINE (PF) 0.5% -1:200000 IJ SOLN
INTRAMUSCULAR | Status: DC | PRN
  Administered 2023-04-02: 30 mL via PERINEURAL

## 2023-04-02 MED ORDER — FENTANYL CITRATE PF 50 MCG/ML IJ SOSY: 25.0000 ug | PREFILLED_SYRINGE | INTRAMUSCULAR | Status: DC | PRN

## 2023-04-02 MED ORDER — TRAMADOL HCL 50 MG PO TABS
50.0000 mg | ORAL_TABLET | Freq: Once | ORAL | Status: AC
  Administered 2023-04-02: 50 mg via ORAL
  Filled 2023-04-02: qty 1

## 2023-04-02 MED ORDER — BUPIVACAINE-EPINEPHRINE (PF) 0.5% -1:200000 IJ SOLN
INTRAMUSCULAR | Status: AC
  Filled 2023-04-02: qty 30

## 2023-04-02 SURGICAL SUPPLY — 40 items
ABLATOR ASPIRATE 50D MULTI-PRT (SURGICAL WAND) IMPLANT
BANDAGE ESMARK 6X9 LF (GAUZE/BANDAGES/DRESSINGS) ×1 IMPLANT
BNDG ELASTIC 6X5.8 VLCR NS LF (GAUZE/BANDAGES/DRESSINGS) ×1 IMPLANT
BNDG ESMARK 6X9 LF (GAUZE/BANDAGES/DRESSINGS) ×1 IMPLANT
CHLORAPREP W/TINT 26 (MISCELLANEOUS) ×1 IMPLANT
CLOTH BEACON ORANGE TIMEOUT ST (SAFETY) ×1 IMPLANT
COOLER ICEMAN CLASSIC (MISCELLANEOUS) ×1 IMPLANT
COUNTER NDL MAGNETIC 40 RED (SET/KITS/TRAYS/PACK) ×1 IMPLANT
COUNTER NEEDLE MAGNETIC 40 RED (SET/KITS/TRAYS/PACK) ×1 IMPLANT
CUFF TRNQT CYL 34X4.125X (TOURNIQUET CUFF) IMPLANT
GAUZE SPONGE 4X4 12PLY STRL (GAUZE/BANDAGES/DRESSINGS) ×1 IMPLANT
GAUZE XEROFORM 5X9 LF (GAUZE/BANDAGES/DRESSINGS) ×1 IMPLANT
GLOVE BIO SURGEON STRL SZ7 (GLOVE) IMPLANT
GLOVE BIOGEL PI IND STRL 7.0 (GLOVE) ×2 IMPLANT
GLOVE SS N UNI LF 8.5 STRL (GLOVE) ×1 IMPLANT
GLOVE SURG POLYISO LF SZ8 (GLOVE) ×1 IMPLANT
GOWN STRL REUS W/TWL LRG LVL3 (GOWN DISPOSABLE) ×1 IMPLANT
GOWN STRL REUS W/TWL XL LVL3 (GOWN DISPOSABLE) ×1 IMPLANT
IV NS IRRIG 3000ML ARTHROMATIC (IV SOLUTION) ×2 IMPLANT
KIT TURNOVER CYSTO (KITS) ×1 IMPLANT
MANIFOLD NEPTUNE II (INSTRUMENTS) ×1 IMPLANT
MARKER SKIN DUAL TIP RULER LAB (MISCELLANEOUS) ×1 IMPLANT
NDL HYPO 21X1.5 SAFETY (NEEDLE) ×1 IMPLANT
NDL SPNL 18GX3.5 QUINCKE PK (NEEDLE) ×1 IMPLANT
NEEDLE HYPO 21X1.5 SAFETY (NEEDLE) ×1 IMPLANT
NEEDLE SPNL 18GX3.5 QUINCKE PK (NEEDLE) ×1 IMPLANT
PACK ARTHRO LIMB DRAPE STRL (MISCELLANEOUS) ×1 IMPLANT
PAD ABD 5X9 TENDERSORB (GAUZE/BANDAGES/DRESSINGS) ×1 IMPLANT
PAD ARMBOARD 7.5X6 YLW CONV (MISCELLANEOUS) ×1 IMPLANT
PAD COLD SHLDR SM WRAP-ON (PAD) IMPLANT
PAD FOR LEG HOLDER (MISCELLANEOUS) ×1 IMPLANT
PADDING CAST COTTON 6X4 STRL (CAST SUPPLIES) ×1 IMPLANT
POSITIONER HEAD 8X9X4 ADT (SOFTGOODS) ×1 IMPLANT
RESECTOR TORPEDO 4MM 13CM CVD (MISCELLANEOUS) IMPLANT
SET ARTHROSCOPY INST (INSTRUMENTS) ×1 IMPLANT
SET BASIN LINEN APH (SET/KITS/TRAYS/PACK) ×1 IMPLANT
SUT 3-0 BLK 1X30 PSL (SUTURE) ×1 IMPLANT
SYR 30ML LL (SYRINGE) ×1 IMPLANT
TUBE CONNECTING 12X1/4 (SUCTIONS) ×1 IMPLANT
TUBING IN/OUT FLOW W/MAIN PUMP (TUBING) ×1 IMPLANT

## 2023-04-02 NOTE — Transfer of Care (Signed)
 Immediate Anesthesia Transfer of Care Note  Patient: Timothy Tran  Procedure(s) Performed: KNEE ARTHROSCOPY WITH LATERAL MENISCECTOMY AND MEDIAL MENISCECTOMY (Right: Knee)  Patient Location: PACU  Anesthesia Type:General  Level of Consciousness: awake, drowsy, and patient cooperative  Airway & Oxygen Therapy: Patient Spontanous Breathing and Patient connected to face mask oxygen  Post-op Assessment: Report given to RN, Post -op Vital signs reviewed and stable, and Patient moving all extremities X 4  Post vital signs: Reviewed and stable  Last Vitals:  Vitals Value Taken Time  BP 115/64 04/02/23 0836  Temp    Pulse 75 04/02/23 0840  Resp 12 04/02/23 0840  SpO2 100 % 04/02/23 0840  Vitals shown include unfiled device data.  Last Pain:  Vitals:   04/02/23 0628  TempSrc: Oral  PainSc: 0-No pain         Complications: No notable events documented.

## 2023-04-02 NOTE — Anesthesia Procedure Notes (Signed)
 Procedure Name: LMA Insertion Date/Time: 04/02/2023 7:36 AM  Performed by: Alex Hylan, CRNAPre-anesthesia Checklist: Emergency Drugs available, Patient identified, Suction available and Patient being monitored Patient Re-evaluated:Patient Re-evaluated prior to induction Oxygen Delivery Method: Circle system utilized Preoxygenation: Pre-oxygenation with 100% oxygen Induction Type: IV induction LMA: LMA inserted LMA Size: 4.0 Number of attempts: 1 Placement Confirmation: positive ETCO2 and breath sounds checked- equal and bilateral Tube secured with: Tape Dental Injury: Teeth and Oropharynx as per pre-operative assessment

## 2023-04-02 NOTE — Op Note (Signed)
  Knee arthroscopy dictation  Orthopaedic Surgery Operative Note (CSN: [id number])  Timothy Tran  [birth year] Date of Surgery: 04/02/2023   Diagnoses:  Torn lateral and medial meniscus right knee  Procedure: Arthroscopy right knee partial medial and partial lateral meniscectomy   Post-Op Diagnosis: Same Surgeons:Primary: Darrin Emerald, MD Assistants: No assistance Location: [facility] ROOM 4 Anesthesia: General Antibiotics: Ancef  2 g Tourniquet time: No tourniquet Estimated Blood Loss: No blood loss Complications: None Specimens: None   Implants: * No implants in log *   Operative findings  MEDIAL - meniscus the medial meniscus was torn especially in the posterior horn the entire posterior horn and root were involved there was a complex degenerative tear -articular surface grade 2 arthritis femoral surface 3 tibial surface  LATERAL - meniscus posterior horn lateral meniscus tear free edge tears complex -articular surface grade 2 lateral femoral condyle and lateral plateau  PTF   -articular surface 2 and 3 changes throughout the trochlea  NOTCH  -acl normal -pcl normal     The patient was identified in the preoperative holding area using 2 approved identification mechanisms. The chart was reviewed and updated. The surgical site was confirmed as right  knee and marked with an indelible marker.  The patient was taken to the operating room for anesthesia. After successful general anesthesia, 2 g Ancef  was used as IV antibiotics.  The patient was placed in the supine position with the (right) the operative extremity in an arthroscopic leg holder and the opposite extremity in a padded leg holder.  The timeout was executed.  A lateral portal was established with an 11 blade and the scope was introduced into the joint. A diagnostic arthroscopy was performed in circumferential manner examining the entire knee joint. A medial portal was established and the diagnostic  arthroscopy was repeated using a probe to palpate intra-articular structures as they were encountered.    The medial meniscus was resected using a duckbill forceps. The meniscal fragments were removed with a motorized shaver. The meniscus was balanced with a combination of a motorized shaver and a 50 ArthroCare wand until a stable rim was obtained.  We turned our attention to the lateral meniscus and basically did the same thing we did to the medial meniscus using a duckbill forceps shaver ArthroCare wand.  We switched portals and completed the contouring and balancing of the meniscus.  The arthroscopic pump was placed on the wash mode and any excess debris was removed from the joint using suction.  30 cc of Marcaine  with epinephrine  was injected through the arthroscope.  The portals were closed with 3-0 nylon suture.  A sterile bandage, Ace wrap and Cryo/Cuff was placed and the Cryo/Cuff was activated. The patient was taken to the recovery room in stable condition.  PHYSICIAN ASSISTANT: no  ASSISTANTS: none   ANESTHESIA:   General  EBL:  none   BLOOD ADMINISTERED:none  DRAINS: none   LOCAL MEDICATIONS USED:  MARCAINE      SPECIMEN:  No Specimen  DISPOSITION OF SPECIMEN:  N/A  COUNTS:  YES   DICTATION: .Dragon Dictation  PLAN OF CARE: Discharge to home after PACU  PATIENT DISPOSITION:  PACU - hemodynamically stable.   Delay start of Pharmacological VTE agent (>24hrs) due to surgical blood loss or risk of bleeding: not applicable  POST OP PLAN  WB as tolerated SUTURES OUT IN A WEEK  AROM  BRACE not needed

## 2023-04-02 NOTE — Interval H&P Note (Signed)
 History and Physical Interval Note:  04/02/2023 7:18 AM  Timothy Tran  has presented today for surgery, with the diagnosis of Torn lateral and medial meniscus right knee.  The various methods of treatment have been discussed with the patient and family. After consideration of risks, benefits and other options for treatment, the patient has consented to  Procedure(s): KNEE ARTHROSCOPY WITH LATERAL MENISCECTOMY AND MEDIAL MENISCECTOMY (Right) as a surgical intervention.  The patient's history has been reviewed, patient examined, no change in status, stable for surgery.  I have reviewed the patient's chart and labs.  Questions were answered to the patient's satisfaction.     Elsa Halls

## 2023-04-02 NOTE — Discharge Instructions (Signed)
 Weight Bearing as tolerated SUTURES OUT IN A WEEK  BRACE not needed

## 2023-04-02 NOTE — Anesthesia Postprocedure Evaluation (Signed)
 Anesthesia Post Note  Patient: MONFORD NASCA  Procedure(s) Performed: KNEE ARTHROSCOPY WITH LATERAL MENISCECTOMY AND MEDIAL MENISCECTOMY (Right: Knee)  Patient location during evaluation: PACU Anesthesia Type: General Level of consciousness: awake and alert Pain management: pain level controlled Vital Signs Assessment: post-procedure vital signs reviewed and stable Respiratory status: spontaneous breathing, nonlabored ventilation, respiratory function stable and patient connected to nasal cannula oxygen Cardiovascular status: blood pressure returned to baseline and stable Postop Assessment: no apparent nausea or vomiting Anesthetic complications: no   There were no known notable events for this encounter.   Last Vitals:  Vitals:   04/02/23 0915 04/02/23 0923  BP: 132/88 (!) 132/93  Pulse: 75 81  Resp: 13 16  Temp:  36.4 C  SpO2: 95% 96%    Last Pain:  Vitals:   04/02/23 0923  TempSrc: Oral  PainSc:                  Sandy Crumb

## 2023-04-02 NOTE — Brief Op Note (Signed)
  Knee arthroscopy dictation  Orthopaedic Surgery Operative Note (CSN: [id number])  GRAM CAPLE  [birth year] Date of Surgery: 04/02/2023   Diagnoses:  Torn lateral and medial meniscus right knee  Procedure: Arthroscopy right knee partial medial and partial lateral meniscectomy   Post-Op Diagnosis: Same Surgeons:Primary: Darrin Emerald, MD Assistants: No assistance Location: [facility] ROOM 4 Anesthesia: General Antibiotics: Ancef  2 g Tourniquet time: No tourniquet Estimated Blood Loss: No blood loss Complications: None Specimens: None   Implants: * No implants in log *   Operative findings  MEDIAL - meniscus the medial meniscus was torn especially in the posterior horn the entire posterior horn and root were involved there was a complex degenerative tear -articular surface grade 2 arthritis femoral surface 3 tibial surface  LATERAL - meniscus posterior horn lateral meniscus tear free edge tears complex -articular surface grade 2 lateral femoral condyle and lateral plateau  PTF   -articular surface 2 and 3 changes throughout the trochlea  NOTCH  -acl normal -pcl normal     The patient was identified in the preoperative holding area using 2 approved identification mechanisms. The chart was reviewed and updated. The surgical site was confirmed as right  knee and marked with an indelible marker.  The patient was taken to the operating room for anesthesia. After successful general anesthesia, 2 g Ancef  was used as IV antibiotics.  The patient was placed in the supine position with the (right) the operative extremity in an arthroscopic leg holder and the opposite extremity in a padded leg holder.  The timeout was executed.  A lateral portal was established with an 11 blade and the scope was introduced into the joint. A diagnostic arthroscopy was performed in circumferential manner examining the entire knee joint. A medial portal was established and the diagnostic  arthroscopy was repeated using a probe to palpate intra-articular structures as they were encountered.    The medial meniscus was resected using a duckbill forceps. The meniscal fragments were removed with a motorized shaver. The meniscus was balanced with a combination of a motorized shaver and a 50 ArthroCare wand until a stable rim was obtained.  We turned our attention to the lateral meniscus and basically did the same thing we did to the medial meniscus using a duckbill forceps shaver ArthroCare wand.  We switched portals and completed the contouring and balancing of the meniscus.  The arthroscopic pump was placed on the wash mode and any excess debris was removed from the joint using suction.  30 cc of Marcaine  with epinephrine  was injected through the arthroscope.  The portals were closed with 3-0 nylon suture.  A sterile bandage, Ace wrap and Cryo/Cuff was placed and the Cryo/Cuff was activated. The patient was taken to the recovery room in stable condition.  PHYSICIAN ASSISTANT: no  ASSISTANTS: none   ANESTHESIA:   General  EBL:  none   BLOOD ADMINISTERED:none  DRAINS: none   LOCAL MEDICATIONS USED:  MARCAINE      SPECIMEN:  No Specimen  DISPOSITION OF SPECIMEN:  N/A  COUNTS:  YES   DICTATION: .Dragon Dictation  PLAN OF CARE: Discharge to home after PACU  PATIENT DISPOSITION:  PACU - hemodynamically stable.   Delay start of Pharmacological VTE agent (>24hrs) due to surgical blood loss or risk of bleeding: not applicable  POST OP PLAN  WB as tolerated SUTURES OUT IN A WEEK  AROM  BRACE not needed

## 2023-04-03 ENCOUNTER — Other Ambulatory Visit: Payer: Self-pay

## 2023-04-03 ENCOUNTER — Encounter (HOSPITAL_COMMUNITY): Payer: Self-pay | Admitting: Orthopedic Surgery

## 2023-04-03 MED ORDER — LOSARTAN POTASSIUM 50 MG PO TABS: 50.0000 mg | ORAL_TABLET | Freq: Two times a day (BID) | ORAL | 3 refills | Status: DC

## 2023-04-09 ENCOUNTER — Ambulatory Visit (INDEPENDENT_AMBULATORY_CARE_PROVIDER_SITE_OTHER): Payer: Medicare Other | Admitting: Orthopedic Surgery

## 2023-04-09 VITALS — BP 131/80 | HR 97

## 2023-04-09 DIAGNOSIS — M23321 Other meniscus derangements, posterior horn of medial meniscus, right knee: Secondary | ICD-10-CM

## 2023-04-09 DIAGNOSIS — M23300 Other meniscus derangements, unspecified lateral meniscus, right knee: Secondary | ICD-10-CM

## 2023-04-09 DIAGNOSIS — Z9889 Other specified postprocedural states: Secondary | ICD-10-CM

## 2023-04-09 NOTE — Progress Notes (Signed)
Patient ID: Timothy Tran, male   DOB: 08-17-1940, 83 y.o.   MRN: [id number]   BP 131/80   Pulse 97   There is no height or weight on file to calculate BMI.  Chief Complaint  Patient presents with   Routine Post Op    Right SARK DOS 04/02/23    Encounter Diagnosis  Name Primary?   S/P right knee arthroscopy 04/02/23 Yes   Encounter Diagnoses  Name Primary?   S/P right knee arthroscopy 04/02/23 Yes   Derangement of posterior horn of medial meniscus of right knee    Derangement of lateral meniscus of right knee      DOI/DOS/ Date: 04/02/23  Improved  Medial and lateral meniscectomies were done  Patient came in without a cane and minimal limp.  Sutures were removed mild swelling in the joint.  Knee flexion arc 90 degrees.  Recommend home exercises continue ice  Decrease the length of his walks to less than 200 yards  Follow-up 3 weeks

## 2023-04-09 NOTE — Progress Notes (Signed)
   BP 131/80   Pulse 97   There is no height or weight on file to calculate BMI.  Chief Complaint  Patient presents with   Routine Post Op    Right SARK DOS 04/02/23    Encounter Diagnosis  Name Primary?   S/P right knee arthroscopy 04/02/23 Yes    DOI/DOS/ Date: 04/02/23  Improved

## 2023-04-29 NOTE — Progress Notes (Unsigned)
   There were no vitals taken for this visit.  There is no height or weight on file to calculate BMI.  No chief complaint on file.   Encounter Diagnosis  Name Primary?   S/P right knee arthroscopy 04/02/23 Yes    DOI/DOS/ Date: 04/02/23  {CHL AMB ORT SYMPTOMS POST TREATMENT:21798}

## 2023-05-01 ENCOUNTER — Encounter: Payer: Self-pay | Admitting: Orthopedic Surgery

## 2023-05-01 ENCOUNTER — Ambulatory Visit (INDEPENDENT_AMBULATORY_CARE_PROVIDER_SITE_OTHER): Payer: Medicare Other | Admitting: Orthopedic Surgery

## 2023-05-01 DIAGNOSIS — Z9889 Other specified postprocedural states: Secondary | ICD-10-CM

## 2023-05-01 NOTE — Progress Notes (Signed)
Postop knee arthroscopy   Chief Complaint  Patient presents with   Post-op Follow-up    Right knee scope improving     Encounter Diagnosis  Name Primary?   S/P right knee arthroscopy 04/02/23 Yes    DOI/DOS/ Date: 04/02/23  Improved  Continued improvement in the right knee has a little bit of soreness in the knee  No swelling walking independently no limp good range of motion.  He can increase his activities and resume normal activities as tolerated and return as needed

## 2023-05-01 NOTE — Patient Instructions (Signed)
Ok to stop Aspirin 325 go back to Aspirin 81 mg daily Take Tylenol if needed for pain

## 2023-05-26 ENCOUNTER — Encounter (HOSPITAL_COMMUNITY): Payer: Self-pay | Admitting: Orthopedic Surgery

## 2023-06-24 DIAGNOSIS — R410 Disorientation, unspecified: Secondary | ICD-10-CM | POA: Diagnosis not present

## 2023-07-01 DIAGNOSIS — D519 Vitamin B12 deficiency anemia, unspecified: Secondary | ICD-10-CM | POA: Diagnosis not present

## 2023-07-01 DIAGNOSIS — G629 Polyneuropathy, unspecified: Secondary | ICD-10-CM | POA: Diagnosis not present

## 2023-07-01 DIAGNOSIS — G4700 Insomnia, unspecified: Secondary | ICD-10-CM | POA: Diagnosis not present

## 2023-07-01 DIAGNOSIS — G5603 Carpal tunnel syndrome, bilateral upper limbs: Secondary | ICD-10-CM | POA: Diagnosis not present

## 2023-07-07 ENCOUNTER — Ambulatory Visit: Admitting: Orthopedic Surgery

## 2023-07-17 ENCOUNTER — Ambulatory Visit: Admitting: Orthopedic Surgery

## 2023-07-17 ENCOUNTER — Encounter: Payer: Self-pay | Admitting: Orthopedic Surgery

## 2023-07-17 DIAGNOSIS — M25461 Effusion, right knee: Secondary | ICD-10-CM | POA: Diagnosis not present

## 2023-07-17 DIAGNOSIS — Z9889 Other specified postprocedural states: Secondary | ICD-10-CM | POA: Diagnosis not present

## 2023-07-17 MED ORDER — METHYLPREDNISOLONE ACETATE 40 MG/ML IJ SUSP
40.0000 mg | Freq: Once | INTRAMUSCULAR | Status: AC
  Administered 2023-07-17: 40 mg via INTRA_ARTICULAR

## 2023-07-17 MED ORDER — PREDNISONE 10 MG PO TABS: 10.0000 mg | ORAL_TABLET | Freq: Three times a day (TID) | ORAL | 0 refills | Status: DC

## 2023-07-17 NOTE — Addendum Note (Signed)
 Addended byArla Lab on: 07/17/2023 03:45 PM   Modules accepted: Orders

## 2023-07-17 NOTE — Patient Instructions (Signed)
 Rest for 2 weeks  Ice the knee for 30 minutes every evening for 2 weeks  Take the medication prescribed

## 2023-07-17 NOTE — Progress Notes (Signed)
   There were no vitals taken for this visit.  There is no height or weight on file to calculate BMI.  Chief Complaint  Patient presents with   Knee Pain    Right knee worse / since knee scope pain has increased     Encounter Diagnosis  Name Primary?   S/P right knee arthroscopy 04/02/23 Yes    DOI/DOS/ Date: 04/02/23  Worse

## 2023-07-17 NOTE — Progress Notes (Signed)
 Chief Complaint  Patient presents with   Knee Pain    Right knee worse / since knee scope pain has increased     Encounter Diagnoses  Name Primary?   S/P right knee arthroscopy 04/02/23    Effusion, right knee Yes     83 year old male recent right knee arthroscopy in January complains of severe right knee pain  He says the only thing he did out of the ordinary was used to push mower  Exam shows he has a limp he has severe pain he is hypersensitive to touch the knee is a little warm not red  He has pain with extension flexes about 90 degrees extension -25 degrees Effusion  Aspiration injection  We aspirated 15 cc of amber-colored yellow fluid  Injected Depo-Medrol  Started on steroids with   Return 2 weeks  Rest and ice   Procedure note injection and aspiration right knee joint  Verbal consent was obtained to aspirate and inject the right knee joint   Timeout was completed to confirm the site of aspiration and injection  An 18-gauge needle was used to aspirate the knee joint from a suprapatellar lateral approach.  The medications used were 40 mg of Depo-Medrol  and 1% lidocaine  3 cc  Anesthesia was provided by ethyl chloride and the skin was prepped with alcohol.  After cleaning the skin with alcohol an 18-gauge needle was used to aspirate the right knee joint.  We obtained 15  cc of fluid amber-yellow  We follow this by injection of 40 mg of Depo-Medrol  and 3 cc 1% lidocaine .  There were no complications. A sterile bandage was applied.

## 2023-07-31 ENCOUNTER — Ambulatory Visit: Admitting: Orthopedic Surgery

## 2023-07-31 DIAGNOSIS — M25461 Effusion, right knee: Secondary | ICD-10-CM | POA: Diagnosis not present

## 2023-07-31 DIAGNOSIS — M23321 Other meniscus derangements, posterior horn of medial meniscus, right knee: Secondary | ICD-10-CM | POA: Diagnosis not present

## 2023-07-31 DIAGNOSIS — Z9889 Other specified postprocedural states: Secondary | ICD-10-CM | POA: Diagnosis not present

## 2023-07-31 MED ORDER — IBUPROFEN 800 MG PO TABS: 800.0000 mg | ORAL_TABLET | Freq: Three times a day (TID) | ORAL | 1 refills | Status: AC | PRN

## 2023-07-31 NOTE — Progress Notes (Signed)
   Chief Complaint  Patient presents with   Knee Pain    Right     83 year old male right knee arthroscopy January 2025 came in with large effusion severe pain on May 1 we did an aspiration injection he comes in today 95% better with almost no pain regained his range of motion walking normally    Our advice is for him to take it easy  He asked about knee replacement and on arthroscopy his cartilage looked good overall so I do not think that is necessary  Repeat episodes of pain and swelling can be managed with aspiration and injection  Prescription for Advil  sent in  Meds ordered this encounter  Medications   ibuprofen  (ADVIL ) 800 MG tablet    Sig: Take 1 tablet (800 mg total) by mouth every 8 (eight) hours as needed.    Dispense:  90 tablet    Refill:  1

## 2023-07-31 NOTE — Progress Notes (Signed)
   There were no vitals taken for this visit.  There is no height or weight on file to calculate BMI.  Chief Complaint  Patient presents with   Knee Pain    Right     No diagnosis found.  DOI/DOS/ Date: 04/02/2023 right knee arthroscopy   Improved walking better feeling better

## 2023-08-22 ENCOUNTER — Ambulatory Visit: Payer: Medicare Other | Admitting: Cardiology

## 2023-08-25 ENCOUNTER — Telehealth: Payer: Self-pay | Admitting: Orthopedic Surgery

## 2023-08-25 NOTE — Telephone Encounter (Signed)
 Dr. Delfino Fellers pt - spoke w/Ann, the pt's spouse, she stated that the pt's rt knee is very painful.  Stated it has been hurting him for 4 days.  She told me he needed to be seen ASAP.  I have added him for 09/12/23 as that is your first available for a follow up.  Please advise if I need to work him in sooner.  [phone]

## 2023-08-27 ENCOUNTER — Other Ambulatory Visit: Payer: Self-pay | Admitting: Orthopedic Surgery

## 2023-08-27 ENCOUNTER — Ambulatory Visit (INDEPENDENT_AMBULATORY_CARE_PROVIDER_SITE_OTHER): Admitting: Orthopedic Surgery

## 2023-08-27 ENCOUNTER — Encounter: Payer: Self-pay | Admitting: Orthopedic Surgery

## 2023-08-27 ENCOUNTER — Telehealth: Payer: Self-pay | Admitting: Orthopedic Surgery

## 2023-08-27 DIAGNOSIS — M25461 Effusion, right knee: Secondary | ICD-10-CM | POA: Diagnosis not present

## 2023-08-27 DIAGNOSIS — Z9889 Other specified postprocedural states: Secondary | ICD-10-CM

## 2023-08-27 DIAGNOSIS — M23321 Other meniscus derangements, posterior horn of medial meniscus, right knee: Secondary | ICD-10-CM

## 2023-08-27 MED ORDER — ACETAMINOPHEN-CODEINE 300-30 MG PO TABS: 1.0000 | ORAL_TABLET | Freq: Four times a day (QID) | ORAL | 0 refills | Status: DC | PRN

## 2023-08-27 MED ORDER — METHYLPREDNISOLONE ACETATE 40 MG/ML IJ SUSP
40.0000 mg | Freq: Once | INTRAMUSCULAR | Status: AC
  Administered 2023-08-27: 40 mg via INTRA_ARTICULAR

## 2023-08-27 MED ORDER — HYDROCODONE-ACETAMINOPHEN 5-325 MG PO TABS: 1.0000 | ORAL_TABLET | Freq: Four times a day (QID) | ORAL | 0 refills | Status: AC | PRN

## 2023-08-27 MED ORDER — PREDNISONE 10 MG PO TABS: 10.0000 mg | ORAL_TABLET | Freq: Three times a day (TID) | ORAL | 0 refills | Status: DC

## 2023-08-27 NOTE — Progress Notes (Signed)
 Chief Complaint  Patient presents with   Knee Pain    S/p knee arthroscopy 04/02/23   Timothy Tran is 83 years old he had arthroscopy of the right knee in January 2025 he did well until about May 2025 when he presented with acute pain and swelling of his knee this was treated with aspiration and injection he did well until about June 4 or 5 he called with acute onset of pain and swelling again in the right knee and he is here today for evaluation  He complains of pain and swelling in his right knee.  He says that he was fine when he got up this morning but once he started walking around the knee began to hurt and he says it is hard for him to bend  His examination reveals that the right knee is warm to touch but no erythema he does have an effusion its mild.  He did an aspiration injection and got back 20 cc of clear yellow fluid and sent it for analysis  I injected the knee with steroids and put him on prednisone  and Tylenol  with codeine for pain I placed him in an Ace wrap and have him follow-up in a week  Procedure note aspiration injection right knee  Procedure note injection and aspiration right knee joint  Verbal consent was obtained to aspirate and inject the right knee joint   Timeout was completed to confirm the site of aspiration and injection  An 18-gauge needle was used to aspirate the knee joint from a suprapatellar lateral approach.  The medications used were 40 mg of Depo-Medrol  and 1% lidocaine  3 cc  Anesthesia was provided by ethyl chloride and the skin was prepped with alcohol.  After cleaning the skin with alcohol an 18-gauge needle was used to aspirate the right knee joint.  We obtained 20  cc of fluid clr  We follow this by injection of 40 mg of Depo-Medrol  and 3 cc 1% lidocaine .  There were no complications. A sterile bandage was applied.

## 2023-08-27 NOTE — Telephone Encounter (Signed)
 Dr. Delfino Fellers pt - spoke w/[facility] [phone], they have the Prednisone , but they don't have Tylenol  #3, he stated no one every uses it.  They want to know if you want to send something else in or if you want him to have the Tylenol  #3 it will need to be sent somewhere else.

## 2023-08-27 NOTE — Progress Notes (Signed)
 Meds ordered this encounter  Medications   HYDROcodone-acetaminophen (NORCO/VICODIN) 5-325 MG tablet    Sig: Take 1 tablet by mouth every 6 (six) hours as needed for moderate pain (pain score 4-6).    Dispense:  30 tablet    Refill:  0

## 2023-09-01 LAB — WOUND CULTURE
MICRO NUMBER:: 16568962
RESULT:: NO GROWTH
SPECIMEN QUALITY:: ADEQUATE

## 2023-09-01 LAB — SYNOVIAL FLUID ANALYSIS, COMPLETE
Basophils, %: 0 %
Eosinophils-Synovial: 0 % (ref 0–2)
Lymphocytes-Synovial Fld: 68 % (ref 0–74)
Monocyte/Macrophage: 9 % (ref 0–69)
Neutrophil, Synovial: 23 % (ref 0–24)
Synoviocytes, %: 0 % (ref 0–15)
WBC, Synovial: 258 {cells}/uL — ABNORMAL HIGH (ref <150)

## 2023-09-05 ENCOUNTER — Ambulatory Visit: Admitting: Orthopedic Surgery

## 2023-09-05 DIAGNOSIS — Z9889 Other specified postprocedural states: Secondary | ICD-10-CM

## 2023-09-05 DIAGNOSIS — M25461 Effusion, right knee: Secondary | ICD-10-CM

## 2023-09-05 MED ORDER — PREDNISONE 10 MG PO TABS: 10.0000 mg | ORAL_TABLET | Freq: Two times a day (BID) | ORAL | 0 refills | Status: AC

## 2023-09-05 NOTE — Progress Notes (Signed)
  Routine follow-up  Status post arthroscopy of the knee January 2025  Postop swelling and pain  Knee aspiration revealed just inflammatory cells no crystals   Chief Complaint  Patient presents with   Knee Pain    Right/ still c/o pain maybe slightly better     Encounter Diagnoses  Name Primary?   S/P right knee arthroscopy 04/02/23    Effusion, right knee    Slight improvement in overall knee function  He has not required any opioid medication despite the prescription  They prednisone  3 times a day may be interfering with his mental status so we are going to cut that back to twice a day  Follow-up in 2 weeks   Meds ordered this encounter  Medications   predniSONE  (DELTASONE ) 10 MG tablet    Sig: Take 1 tablet (10 mg total) by mouth 2 (two) times daily with a meal.    Dispense:  60 tablet    Refill:  0

## 2023-09-05 NOTE — Progress Notes (Signed)
   There were no vitals taken for this visit.  There is no height or weight on file to calculate BMI.  Chief Complaint  Patient presents with   Knee Pain    Right/ still c/o pain maybe slightly better     No diagnosis found.  DOI/DOS/ Date: s/p Arthroscopy 04/02/23 Slightly improved

## 2023-09-12 ENCOUNTER — Ambulatory Visit: Admitting: Orthopedic Surgery

## 2023-09-18 ENCOUNTER — Ambulatory Visit: Admitting: Orthopedic Surgery

## 2023-09-18 DIAGNOSIS — R7301 Impaired fasting glucose: Secondary | ICD-10-CM | POA: Diagnosis not present

## 2023-09-18 DIAGNOSIS — E782 Mixed hyperlipidemia: Secondary | ICD-10-CM | POA: Diagnosis not present

## 2023-09-18 DIAGNOSIS — I10 Essential (primary) hypertension: Secondary | ICD-10-CM | POA: Diagnosis not present

## 2023-09-24 DIAGNOSIS — N401 Enlarged prostate with lower urinary tract symptoms: Secondary | ICD-10-CM | POA: Diagnosis not present

## 2023-09-24 DIAGNOSIS — L299 Pruritus, unspecified: Secondary | ICD-10-CM | POA: Diagnosis not present

## 2023-09-24 DIAGNOSIS — R7301 Impaired fasting glucose: Secondary | ICD-10-CM | POA: Diagnosis not present

## 2023-09-24 DIAGNOSIS — E782 Mixed hyperlipidemia: Secondary | ICD-10-CM | POA: Diagnosis not present

## 2023-09-24 DIAGNOSIS — G629 Polyneuropathy, unspecified: Secondary | ICD-10-CM | POA: Diagnosis not present

## 2023-09-24 DIAGNOSIS — I2510 Atherosclerotic heart disease of native coronary artery without angina pectoris: Secondary | ICD-10-CM | POA: Diagnosis not present

## 2023-09-24 DIAGNOSIS — D72819 Decreased white blood cell count, unspecified: Secondary | ICD-10-CM | POA: Diagnosis not present

## 2023-09-24 DIAGNOSIS — R809 Proteinuria, unspecified: Secondary | ICD-10-CM | POA: Diagnosis not present

## 2023-09-24 DIAGNOSIS — I10 Essential (primary) hypertension: Secondary | ICD-10-CM | POA: Diagnosis not present

## 2023-09-24 DIAGNOSIS — K219 Gastro-esophageal reflux disease without esophagitis: Secondary | ICD-10-CM | POA: Diagnosis not present

## 2023-09-24 DIAGNOSIS — G4700 Insomnia, unspecified: Secondary | ICD-10-CM | POA: Diagnosis not present

## 2023-09-24 DIAGNOSIS — D696 Thrombocytopenia, unspecified: Secondary | ICD-10-CM | POA: Diagnosis not present

## 2023-09-29 DIAGNOSIS — M25562 Pain in left knee: Secondary | ICD-10-CM | POA: Diagnosis not present

## 2023-09-29 DIAGNOSIS — M1711 Unilateral primary osteoarthritis, right knee: Secondary | ICD-10-CM | POA: Diagnosis not present

## 2023-09-29 DIAGNOSIS — M71121 Other infective bursitis, right elbow: Secondary | ICD-10-CM | POA: Diagnosis not present

## 2023-10-01 DIAGNOSIS — R410 Disorientation, unspecified: Secondary | ICD-10-CM | POA: Diagnosis not present

## 2023-10-14 DIAGNOSIS — M7121 Synovial cyst of popliteal space [Baker], right knee: Secondary | ICD-10-CM | POA: Diagnosis not present

## 2023-10-14 DIAGNOSIS — M1711 Unilateral primary osteoarthritis, right knee: Secondary | ICD-10-CM | POA: Diagnosis not present

## 2023-10-27 DIAGNOSIS — M1711 Unilateral primary osteoarthritis, right knee: Secondary | ICD-10-CM | POA: Diagnosis not present

## 2023-10-29 ENCOUNTER — Other Ambulatory Visit: Payer: Self-pay | Admitting: Urology

## 2023-10-29 DIAGNOSIS — N138 Other obstructive and reflux uropathy: Secondary | ICD-10-CM

## 2023-11-03 DIAGNOSIS — M1711 Unilateral primary osteoarthritis, right knee: Secondary | ICD-10-CM | POA: Diagnosis not present

## 2023-11-03 DIAGNOSIS — M7121 Synovial cyst of popliteal space [Baker], right knee: Secondary | ICD-10-CM | POA: Diagnosis not present

## 2023-11-10 DIAGNOSIS — M1711 Unilateral primary osteoarthritis, right knee: Secondary | ICD-10-CM | POA: Diagnosis not present

## 2023-11-11 DIAGNOSIS — M6281 Muscle weakness (generalized): Secondary | ICD-10-CM | POA: Diagnosis not present

## 2023-11-11 DIAGNOSIS — M25561 Pain in right knee: Secondary | ICD-10-CM | POA: Diagnosis not present

## 2023-11-11 DIAGNOSIS — R2689 Other abnormalities of gait and mobility: Secondary | ICD-10-CM | POA: Diagnosis not present

## 2023-11-11 DIAGNOSIS — M25661 Stiffness of right knee, not elsewhere classified: Secondary | ICD-10-CM | POA: Diagnosis not present

## 2023-11-12 DIAGNOSIS — R2689 Other abnormalities of gait and mobility: Secondary | ICD-10-CM | POA: Diagnosis not present

## 2023-11-12 DIAGNOSIS — M25561 Pain in right knee: Secondary | ICD-10-CM | POA: Diagnosis not present

## 2023-11-12 DIAGNOSIS — M6281 Muscle weakness (generalized): Secondary | ICD-10-CM | POA: Diagnosis not present

## 2023-11-12 DIAGNOSIS — M25661 Stiffness of right knee, not elsewhere classified: Secondary | ICD-10-CM | POA: Diagnosis not present

## 2023-11-18 DIAGNOSIS — M25561 Pain in right knee: Secondary | ICD-10-CM | POA: Diagnosis not present

## 2023-11-18 DIAGNOSIS — M25661 Stiffness of right knee, not elsewhere classified: Secondary | ICD-10-CM | POA: Diagnosis not present

## 2023-11-18 DIAGNOSIS — M6281 Muscle weakness (generalized): Secondary | ICD-10-CM | POA: Diagnosis not present

## 2023-11-18 DIAGNOSIS — R2689 Other abnormalities of gait and mobility: Secondary | ICD-10-CM | POA: Diagnosis not present

## 2023-11-20 DIAGNOSIS — M25661 Stiffness of right knee, not elsewhere classified: Secondary | ICD-10-CM | POA: Diagnosis not present

## 2023-11-20 DIAGNOSIS — M6281 Muscle weakness (generalized): Secondary | ICD-10-CM | POA: Diagnosis not present

## 2023-11-20 DIAGNOSIS — R2689 Other abnormalities of gait and mobility: Secondary | ICD-10-CM | POA: Diagnosis not present

## 2023-11-20 DIAGNOSIS — M25561 Pain in right knee: Secondary | ICD-10-CM | POA: Diagnosis not present

## 2023-11-24 DIAGNOSIS — M1711 Unilateral primary osteoarthritis, right knee: Secondary | ICD-10-CM | POA: Diagnosis not present

## 2023-11-24 DIAGNOSIS — M7121 Synovial cyst of popliteal space [Baker], right knee: Secondary | ICD-10-CM | POA: Diagnosis not present

## 2023-11-27 DIAGNOSIS — R2689 Other abnormalities of gait and mobility: Secondary | ICD-10-CM | POA: Diagnosis not present

## 2023-11-27 DIAGNOSIS — M25561 Pain in right knee: Secondary | ICD-10-CM | POA: Diagnosis not present

## 2023-11-27 DIAGNOSIS — M25661 Stiffness of right knee, not elsewhere classified: Secondary | ICD-10-CM | POA: Diagnosis not present

## 2023-11-27 DIAGNOSIS — M6281 Muscle weakness (generalized): Secondary | ICD-10-CM | POA: Diagnosis not present

## 2023-12-02 DIAGNOSIS — M6281 Muscle weakness (generalized): Secondary | ICD-10-CM | POA: Diagnosis not present

## 2023-12-02 DIAGNOSIS — M25561 Pain in right knee: Secondary | ICD-10-CM | POA: Diagnosis not present

## 2023-12-02 DIAGNOSIS — R2689 Other abnormalities of gait and mobility: Secondary | ICD-10-CM | POA: Diagnosis not present

## 2023-12-02 DIAGNOSIS — M25661 Stiffness of right knee, not elsewhere classified: Secondary | ICD-10-CM | POA: Diagnosis not present

## 2023-12-04 DIAGNOSIS — R2689 Other abnormalities of gait and mobility: Secondary | ICD-10-CM | POA: Diagnosis not present

## 2023-12-04 DIAGNOSIS — M25561 Pain in right knee: Secondary | ICD-10-CM | POA: Diagnosis not present

## 2023-12-04 DIAGNOSIS — M25661 Stiffness of right knee, not elsewhere classified: Secondary | ICD-10-CM | POA: Diagnosis not present

## 2023-12-04 DIAGNOSIS — M6281 Muscle weakness (generalized): Secondary | ICD-10-CM | POA: Diagnosis not present

## 2023-12-09 DIAGNOSIS — R2689 Other abnormalities of gait and mobility: Secondary | ICD-10-CM | POA: Diagnosis not present

## 2023-12-09 DIAGNOSIS — M25661 Stiffness of right knee, not elsewhere classified: Secondary | ICD-10-CM | POA: Diagnosis not present

## 2023-12-09 DIAGNOSIS — M6281 Muscle weakness (generalized): Secondary | ICD-10-CM | POA: Diagnosis not present

## 2023-12-09 DIAGNOSIS — M25561 Pain in right knee: Secondary | ICD-10-CM | POA: Diagnosis not present

## 2023-12-11 DIAGNOSIS — M25661 Stiffness of right knee, not elsewhere classified: Secondary | ICD-10-CM | POA: Diagnosis not present

## 2023-12-11 DIAGNOSIS — R2689 Other abnormalities of gait and mobility: Secondary | ICD-10-CM | POA: Diagnosis not present

## 2023-12-11 DIAGNOSIS — M6281 Muscle weakness (generalized): Secondary | ICD-10-CM | POA: Diagnosis not present

## 2023-12-11 DIAGNOSIS — M25561 Pain in right knee: Secondary | ICD-10-CM | POA: Diagnosis not present

## 2023-12-16 DIAGNOSIS — M25661 Stiffness of right knee, not elsewhere classified: Secondary | ICD-10-CM | POA: Diagnosis not present

## 2023-12-16 DIAGNOSIS — M6281 Muscle weakness (generalized): Secondary | ICD-10-CM | POA: Diagnosis not present

## 2023-12-16 DIAGNOSIS — R2689 Other abnormalities of gait and mobility: Secondary | ICD-10-CM | POA: Diagnosis not present

## 2023-12-16 DIAGNOSIS — M25561 Pain in right knee: Secondary | ICD-10-CM | POA: Diagnosis not present

## 2023-12-19 DIAGNOSIS — R2689 Other abnormalities of gait and mobility: Secondary | ICD-10-CM | POA: Diagnosis not present

## 2023-12-19 DIAGNOSIS — M25661 Stiffness of right knee, not elsewhere classified: Secondary | ICD-10-CM | POA: Diagnosis not present

## 2023-12-19 DIAGNOSIS — M25561 Pain in right knee: Secondary | ICD-10-CM | POA: Diagnosis not present

## 2023-12-19 DIAGNOSIS — M6281 Muscle weakness (generalized): Secondary | ICD-10-CM | POA: Diagnosis not present

## 2023-12-22 DIAGNOSIS — M25561 Pain in right knee: Secondary | ICD-10-CM | POA: Diagnosis not present

## 2023-12-22 DIAGNOSIS — L2081 Atopic neurodermatitis: Secondary | ICD-10-CM | POA: Diagnosis not present

## 2023-12-22 DIAGNOSIS — G4700 Insomnia, unspecified: Secondary | ICD-10-CM | POA: Diagnosis not present

## 2023-12-22 DIAGNOSIS — S83412A Sprain of medial collateral ligament of left knee, initial encounter: Secondary | ICD-10-CM | POA: Diagnosis not present

## 2023-12-22 DIAGNOSIS — G629 Polyneuropathy, unspecified: Secondary | ICD-10-CM | POA: Diagnosis not present

## 2023-12-22 DIAGNOSIS — G5603 Carpal tunnel syndrome, bilateral upper limbs: Secondary | ICD-10-CM | POA: Diagnosis not present

## 2023-12-22 DIAGNOSIS — R202 Paresthesia of skin: Secondary | ICD-10-CM | POA: Diagnosis not present

## 2023-12-22 DIAGNOSIS — M25562 Pain in left knee: Secondary | ICD-10-CM | POA: Diagnosis not present

## 2023-12-22 DIAGNOSIS — M1711 Unilateral primary osteoarthritis, right knee: Secondary | ICD-10-CM | POA: Diagnosis not present

## 2023-12-22 DIAGNOSIS — R42 Dizziness and giddiness: Secondary | ICD-10-CM | POA: Diagnosis not present

## 2023-12-24 DIAGNOSIS — M25561 Pain in right knee: Secondary | ICD-10-CM | POA: Diagnosis not present

## 2023-12-24 DIAGNOSIS — M25661 Stiffness of right knee, not elsewhere classified: Secondary | ICD-10-CM | POA: Diagnosis not present

## 2023-12-24 DIAGNOSIS — R2689 Other abnormalities of gait and mobility: Secondary | ICD-10-CM | POA: Diagnosis not present

## 2023-12-24 DIAGNOSIS — M6281 Muscle weakness (generalized): Secondary | ICD-10-CM | POA: Diagnosis not present

## 2023-12-25 DIAGNOSIS — R809 Proteinuria, unspecified: Secondary | ICD-10-CM | POA: Diagnosis not present

## 2023-12-25 DIAGNOSIS — R7301 Impaired fasting glucose: Secondary | ICD-10-CM | POA: Diagnosis not present

## 2023-12-25 DIAGNOSIS — E782 Mixed hyperlipidemia: Secondary | ICD-10-CM | POA: Diagnosis not present

## 2023-12-26 DIAGNOSIS — M6281 Muscle weakness (generalized): Secondary | ICD-10-CM | POA: Diagnosis not present

## 2023-12-26 DIAGNOSIS — M25661 Stiffness of right knee, not elsewhere classified: Secondary | ICD-10-CM | POA: Diagnosis not present

## 2023-12-26 DIAGNOSIS — R2689 Other abnormalities of gait and mobility: Secondary | ICD-10-CM | POA: Diagnosis not present

## 2023-12-26 DIAGNOSIS — M25561 Pain in right knee: Secondary | ICD-10-CM | POA: Diagnosis not present

## 2023-12-31 DIAGNOSIS — M6281 Muscle weakness (generalized): Secondary | ICD-10-CM | POA: Diagnosis not present

## 2023-12-31 DIAGNOSIS — R2689 Other abnormalities of gait and mobility: Secondary | ICD-10-CM | POA: Diagnosis not present

## 2023-12-31 DIAGNOSIS — M25561 Pain in right knee: Secondary | ICD-10-CM | POA: Diagnosis not present

## 2023-12-31 DIAGNOSIS — M25661 Stiffness of right knee, not elsewhere classified: Secondary | ICD-10-CM | POA: Diagnosis not present

## 2024-01-01 DIAGNOSIS — R7301 Impaired fasting glucose: Secondary | ICD-10-CM | POA: Diagnosis not present

## 2024-01-01 DIAGNOSIS — R4189 Other symptoms and signs involving cognitive functions and awareness: Secondary | ICD-10-CM | POA: Diagnosis not present

## 2024-01-01 DIAGNOSIS — Z0000 Encounter for general adult medical examination without abnormal findings: Secondary | ICD-10-CM | POA: Diagnosis not present

## 2024-01-01 DIAGNOSIS — I10 Essential (primary) hypertension: Secondary | ICD-10-CM | POA: Diagnosis not present

## 2024-01-01 DIAGNOSIS — D696 Thrombocytopenia, unspecified: Secondary | ICD-10-CM | POA: Diagnosis not present

## 2024-01-01 DIAGNOSIS — E782 Mixed hyperlipidemia: Secondary | ICD-10-CM | POA: Diagnosis not present

## 2024-01-01 DIAGNOSIS — D72819 Decreased white blood cell count, unspecified: Secondary | ICD-10-CM | POA: Diagnosis not present

## 2024-01-01 DIAGNOSIS — Z0001 Encounter for general adult medical examination with abnormal findings: Secondary | ICD-10-CM | POA: Diagnosis not present

## 2024-01-01 DIAGNOSIS — R809 Proteinuria, unspecified: Secondary | ICD-10-CM | POA: Diagnosis not present

## 2024-01-01 DIAGNOSIS — N401 Enlarged prostate with lower urinary tract symptoms: Secondary | ICD-10-CM | POA: Diagnosis not present

## 2024-01-01 DIAGNOSIS — I2510 Atherosclerotic heart disease of native coronary artery without angina pectoris: Secondary | ICD-10-CM | POA: Diagnosis not present

## 2024-01-01 DIAGNOSIS — K219 Gastro-esophageal reflux disease without esophagitis: Secondary | ICD-10-CM | POA: Diagnosis not present

## 2024-01-01 DIAGNOSIS — E559 Vitamin D deficiency, unspecified: Secondary | ICD-10-CM | POA: Diagnosis not present

## 2024-01-02 DIAGNOSIS — M6281 Muscle weakness (generalized): Secondary | ICD-10-CM | POA: Diagnosis not present

## 2024-01-02 DIAGNOSIS — R2689 Other abnormalities of gait and mobility: Secondary | ICD-10-CM | POA: Diagnosis not present

## 2024-01-02 DIAGNOSIS — M25661 Stiffness of right knee, not elsewhere classified: Secondary | ICD-10-CM | POA: Diagnosis not present

## 2024-01-02 DIAGNOSIS — M25561 Pain in right knee: Secondary | ICD-10-CM | POA: Diagnosis not present

## 2024-01-06 DIAGNOSIS — M25561 Pain in right knee: Secondary | ICD-10-CM | POA: Diagnosis not present

## 2024-01-06 DIAGNOSIS — M25661 Stiffness of right knee, not elsewhere classified: Secondary | ICD-10-CM | POA: Diagnosis not present

## 2024-01-06 DIAGNOSIS — M6281 Muscle weakness (generalized): Secondary | ICD-10-CM | POA: Diagnosis not present

## 2024-01-06 DIAGNOSIS — R2689 Other abnormalities of gait and mobility: Secondary | ICD-10-CM | POA: Diagnosis not present

## 2024-01-09 DIAGNOSIS — R2689 Other abnormalities of gait and mobility: Secondary | ICD-10-CM | POA: Diagnosis not present

## 2024-01-09 DIAGNOSIS — M25561 Pain in right knee: Secondary | ICD-10-CM | POA: Diagnosis not present

## 2024-01-09 DIAGNOSIS — M25661 Stiffness of right knee, not elsewhere classified: Secondary | ICD-10-CM | POA: Diagnosis not present

## 2024-01-09 DIAGNOSIS — M6281 Muscle weakness (generalized): Secondary | ICD-10-CM | POA: Diagnosis not present

## 2024-01-12 DIAGNOSIS — M25561 Pain in right knee: Secondary | ICD-10-CM | POA: Diagnosis not present

## 2024-01-12 DIAGNOSIS — M6281 Muscle weakness (generalized): Secondary | ICD-10-CM | POA: Diagnosis not present

## 2024-01-12 DIAGNOSIS — M25661 Stiffness of right knee, not elsewhere classified: Secondary | ICD-10-CM | POA: Diagnosis not present

## 2024-01-12 DIAGNOSIS — R2689 Other abnormalities of gait and mobility: Secondary | ICD-10-CM | POA: Diagnosis not present

## 2024-01-15 DIAGNOSIS — M25561 Pain in right knee: Secondary | ICD-10-CM | POA: Diagnosis not present

## 2024-01-15 DIAGNOSIS — M6281 Muscle weakness (generalized): Secondary | ICD-10-CM | POA: Diagnosis not present

## 2024-01-15 DIAGNOSIS — M25661 Stiffness of right knee, not elsewhere classified: Secondary | ICD-10-CM | POA: Diagnosis not present

## 2024-01-15 DIAGNOSIS — R2689 Other abnormalities of gait and mobility: Secondary | ICD-10-CM | POA: Diagnosis not present

## 2024-01-22 ENCOUNTER — Ambulatory Visit (HOSPITAL_COMMUNITY)
Admission: RE | Admit: 2024-01-22 | Discharge: 2024-01-22 | Disposition: A | Discharge status: Home / Self Care | Source: Ambulatory Visit | Attending: Nurse Practitioner | Admitting: Nurse Practitioner

## 2024-01-22 ENCOUNTER — Other Ambulatory Visit (HOSPITAL_COMMUNITY): Payer: Self-pay | Admitting: Nurse Practitioner

## 2024-01-22 DIAGNOSIS — R918 Other nonspecific abnormal finding of lung field: Secondary | ICD-10-CM | POA: Diagnosis not present

## 2024-01-22 DIAGNOSIS — R0602 Shortness of breath: Secondary | ICD-10-CM | POA: Insufficient documentation

## 2024-01-22 DIAGNOSIS — I771 Stricture of artery: Secondary | ICD-10-CM | POA: Diagnosis not present

## 2024-01-22 DIAGNOSIS — R600 Localized edema: Secondary | ICD-10-CM | POA: Diagnosis not present

## 2024-01-22 DIAGNOSIS — N39498 Other specified urinary incontinence: Secondary | ICD-10-CM | POA: Diagnosis not present

## 2024-01-23 ENCOUNTER — Encounter: Payer: Self-pay | Admitting: Physician Assistant

## 2024-01-30 ENCOUNTER — Encounter: Payer: Self-pay | Admitting: Cardiology

## 2024-01-30 ENCOUNTER — Ambulatory Visit: Attending: Cardiology | Admitting: Cardiology

## 2024-01-30 VITALS — BP 144/84 | HR 85 | Ht 68.0 in | Wt 204.6 lb

## 2024-01-30 DIAGNOSIS — E782 Mixed hyperlipidemia: Secondary | ICD-10-CM | POA: Insufficient documentation

## 2024-01-30 DIAGNOSIS — I25119 Atherosclerotic heart disease of native coronary artery with unspecified angina pectoris: Secondary | ICD-10-CM | POA: Insufficient documentation

## 2024-01-30 DIAGNOSIS — I10 Essential (primary) hypertension: Secondary | ICD-10-CM | POA: Diagnosis present

## 2024-01-30 DIAGNOSIS — I495 Sick sinus syndrome: Secondary | ICD-10-CM | POA: Diagnosis not present

## 2024-01-30 DIAGNOSIS — R600 Localized edema: Secondary | ICD-10-CM | POA: Diagnosis not present

## 2024-01-30 DIAGNOSIS — Z79899 Other long term (current) drug therapy: Secondary | ICD-10-CM | POA: Diagnosis not present

## 2024-01-30 MED ORDER — POTASSIUM CHLORIDE CRYS ER 10 MEQ PO TBCR: 10.0000 meq | EXTENDED_RELEASE_TABLET | Freq: Every day | ORAL | 3 refills | Status: AC

## 2024-01-30 MED ORDER — FUROSEMIDE 40 MG PO TABS: 40.0000 mg | ORAL_TABLET | Freq: Every day | ORAL | 3 refills | Status: AC

## 2024-01-30 NOTE — Patient Instructions (Signed)
 Medication Instructions:   START Lasix 40 mg daily  START Potassium 10 meq daily  Labwork: BMET in 2 weeks at [facility]  Testing/Procedures: None today  Follow-Up: 6 weeks   Any Other Special Instructions Will Be Listed Below (If Applicable).  If you need a refill on your cardiac medications before your next appointment, please call your pharmacy.

## 2024-01-30 NOTE — Progress Notes (Signed)
    Cardiology Office Note  Date: 01/30/2024   ID: Timothy Tran, DOB [birth year], MRN [id number]  History of Present Illness: Timothy Tran is an 83 y.o. male last seen in November 2024.  He is here today with family members for a follow-up visit.  He reports having trouble with bilateral lower leg edema over the last several weeks.  He did undergo right knee arthroscopy in January with an effusion on that side, follow-up with orthopedics.  He was given a limited course of Lasix with potassium supplement by PCP, did not make a substantial impact overall.  His BNP was normal by recent lab work, creatinine 1.14 with normal GFR.  LVEF normal range by echocardiogram last year.  He does not report any angina or nitroglycerin use.  Also having trouble with memory loss and pending consultation per neurology for assessment of dementia.  We went over his medications today.  Discussed starting daily Lasix 40 mg with potassium supplement.  I reviewed his ECG today which shows normal sinus rhythm.  Physical Exam: VS:  BP (!) 144/84   Pulse 85   Ht 5' 8 (1.727 m)   Wt 204 lb 9.6 oz (92.8 kg)   SpO2 96%   BMI 31.11 kg/m , BMI Body mass index is 31.11 kg/m.  Wt Readings from Last 3 Encounters:  01/30/24 204 lb 9.6 oz (92.8 kg)  04/02/23 200 lb (90.7 kg)  02/19/23 195 lb (88.5 kg)    General: Patient appears comfortable at rest. HEENT: Conjunctiva and lids normal. Neck: Supple, no elevated JVP or carotid bruits. Lungs: Clear to auscultation, nonlabored breathing at rest. Cardiac: Regular rate and rhythm, no S3 or significant systolic murmur. Extremities: 2+ lower leg edema bilaterally.  ECG:  An ECG dated 11/21/2022 was personally reviewed today and demonstrated:  Sinus bradycardia with prolonged PR interval.  Labwork: 03/31/2023: BUN 14; Creatinine, Ser 0.76; Hemoglobin 14.9; Platelets 144; Potassium 3.6; Sodium 137  October 2025: TSH 2.81, hemoglobin 15.1, platelets 150, triglycerides 136,  HDL 41, LDL 74 November 2025: BUN 14, creatinine 1.14, GFR 64, potassium 4.1, AST 21, ALT 22, BNP 12.6  Other Studies Reviewed Today:  No interval cardiac testing for review today.  Assessment and Plan:  1.  CAD status post BMS to the LAD and ramus intermedius in 2008.  LVEF 60 to 65% by echocardiogram in October 2024.  No active angina at current level of activity, no interval nitroglycerin use.  He is currently on aspirin  325 mg daily and Lipitor 40 mg daily.  2.  Bilateral leg edema/lymphedema.  Plan to start Lasix 40 mg daily with KCl 10 mEq daily.  Recheck BMET in 2 weeks.  Clinical follow-up arranged.  If symptoms do not improve can consider follow-up echocardiogram.   3.  Mixed hyperlipidemia.  LDL 74 in October.  Continue Lipitor 40 mg daily.   4.  Primary hypertension.  Continue Norvasc  5 mg daily and Cozaar  50 mg twice daily.   5.  Sinus node dysfunction, following clinically at this point.  He did have a visit with Dr. Waddell in early November, I reviewed the note.  Currently asymptomatic and not on any AV nodal blockers.  Disposition:  Follow up 6 weeks.  Signed, Jayson JUDITHANN Sierras, M.D., F.A.C.C. [facility] at [facility]

## 2024-02-04 ENCOUNTER — Other Ambulatory Visit: Payer: Self-pay | Admitting: Medical

## 2024-02-04 ENCOUNTER — Other Ambulatory Visit: Payer: Self-pay | Admitting: Urology

## 2024-02-04 DIAGNOSIS — N138 Other obstructive and reflux uropathy: Secondary | ICD-10-CM

## 2024-02-16 ENCOUNTER — Ambulatory Visit: Payer: Self-pay | Admitting: Cardiology

## 2024-02-16 ENCOUNTER — Other Ambulatory Visit (HOSPITAL_COMMUNITY)
Admission: RE | Admit: 2024-02-16 | Discharge: 2024-02-16 | Disposition: A | Source: Ambulatory Visit | Attending: Cardiology | Admitting: Cardiology

## 2024-02-16 DIAGNOSIS — Z79899 Other long term (current) drug therapy: Secondary | ICD-10-CM | POA: Diagnosis not present

## 2024-02-16 LAB — BASIC METABOLIC PANEL WITH GFR
Anion gap: 14 (ref 5–15)
BUN: 17 mg/dL (ref 8–23)
CO2: 25 mmol/L (ref 22–32)
Calcium: 9.4 mg/dL (ref 8.9–10.3)
Chloride: 101 mmol/L (ref 98–111)
Creatinine, Ser: 1.36 mg/dL — ABNORMAL HIGH (ref 0.61–1.24)
GFR, Estimated: 52 mL/min — ABNORMAL LOW (ref >60)
Glucose, Bld: 143 mg/dL — ABNORMAL HIGH (ref 70–99)
Potassium: 4.6 mmol/L (ref 3.5–5.1)
Sodium: 139 mmol/L (ref 135–145)

## 2024-04-02 ENCOUNTER — Ambulatory Visit: Attending: Student | Admitting: Student

## 2024-04-02 NOTE — Progress Notes (Unsigned)
 "  Cardiology Office Note    Date:  04/02/2024  ID:  Timothy Tran, DOB 10-Nov-1940, MRN [id number] Cardiologist: Jayson Sierras, MD { :  History of Present Illness:    Timothy Tran is a 84 y.o. male with past medical history of CAD (s/p BMS to LAD and ramus in 2008, low risk NST in 10/2020), HTN, HLD, sinus node dysfunction (watchful waiting recommended by EP) and lower extremity edema/lymphedema who presents to the office today for 12-month follow-up.  He was last examined by Dr. Sierras in 01/2024 and reported having worsening lower extremity edema over the past several weeks.  He had 2+ pitting edema on examination and was started on Lasix  40 mg daily along with potassium supplementation.  It was recommend that if symptoms did not improve, would consider a follow-up echocardiogram.  Follow-up labs showed his K+ was normal at 4.6 but creatinine had trended up to 1.36.  Was recommended to keep scheduled follow-up to reassess response.  - BMET - Echo?  ROS: ***  Studies Reviewed:   EKG: EKG is*** ordered today and demonstrates ***   EKG Interpretation Date/Time:    Ventricular Rate:    PR Interval:    QRS Duration:    QT Interval:    QTC Calculation:   R Axis:      Text Interpretation:         Event Monitor: 11/2022 ZIO monitor reviewed.  1 day, 4 hours analyzed.   Predominant rhythm is sinus with prolonged PR interval, heart rate ranging from 36 bpm up to 140 bpm and average heart rate 63 bpm. There were occasional PACs representing 3.3% total beats with otherwise rare atrial couplets. There were occasional PVCs representing 3.1% total beats with otherwise rare ventricular couplets and limited episodes of ventricular bigeminy and trigeminy. Two very brief episodes of SVT were noted, the longest of which was only 6 beats. No sustained arrhythmias, pauses, or heart block.  Echocardiogram: 12/2022 IMPRESSIONS     1. Left ventricular ejection fraction, by estimation,  is 60 to 65%. The  left ventricle has normal function. The left ventricle has no regional  wall motion abnormalities. Left ventricular diastolic parameters are  consistent with Grade I diastolic  dysfunction (impaired relaxation).   2. Right ventricular systolic function is normal. The right ventricular  size is normal. Tricuspid regurgitation signal is inadequate for assessing  PA pressure.   3. Left atrial size was moderately dilated.   4. The mitral valve is normal in structure. Trivial mitral valve  regurgitation. No evidence of mitral stenosis.   5. The aortic valve is tricuspid. Aortic valve regurgitation is not  visualized. No aortic stenosis is present.   6. Aortic dilatation noted. There is mild dilatation of the aortic root,  measuring 41 mm.   7. The inferior vena cava is normal in size with greater than 50%  respiratory variability, suggesting right atrial pressure of 3 mmHg.   Comparison(s): No prior Echocardiogram.    Risk Assessment/Calculations:   {Does this patient have ATRIAL FIBRILLATION?[phone]} No BP recorded.  {Refresh Note OR Click here to enter BP  :1}***         Physical Exam:   VS:  There were no vitals taken for this visit.   Wt Readings from Last 3 Encounters:  01/30/24 204 lb 9.6 oz (92.8 kg)  04/02/23 200 lb (90.7 kg)  02/19/23 195 lb (88.5 kg)     GEN: Well nourished, well developed in no acute distress  NECK: No JVD; No carotid bruits CARDIAC: ***RRR, no murmurs, rubs, gallops RESPIRATORY:  Clear to auscultation without rales, wheezing or rhonchi  ABDOMEN: Appears non-distended. No obvious abdominal masses. EXTREMITIES: No clubbing or cyanosis. No edema.  Distal pedal pulses are 2+ bilaterally.   Assessment and Plan:   1. Coronary artery disease involving native coronary artery of native heart with angina pectoris - He previously underwent stenting to the LAD and ramus in 2008.  Most recent ischemic evaluation was a low risk NST in  10/2020. ***  2. Essential hypertension - BP is at***during today's visit. Continue current medical therapy with Amlodipine  5 mg daily, Losartan  50 mg twice daily and Lasix  40 mg daily.  3. Hyperlipidemia LDL goal <70 - LDL was at 74 when checked in 12/2023.  Continue current medical therapy with atorvastatin  40 mg daily.  4. Bilateral leg edema ***    Signed, Laymon CHRISTELLA Qua, PA-C   "

## 2024-04-02 NOTE — Progress Notes (Incomplete)
 "      Memory Impairment of unclear etiology  Timothy Tran is a very pleasant 84 y.o. year old RH male with a history of hypertension, hyperlipidemia, CAD peripheral neuropathy, DM, insomnia, BPPV, OSA on CPAP thrombocytopenia, leukopenia, bradycardia seen today for evaluation of memory loss.  He had recent plasma biomarkers unrevealing, not yielding the presence of Alzheimer's pathology.  MoCA today is ***. Etiology is unclear.***. Patient is able to participate on ADLs and to to drive without difficulties. Mood is *** . Patient is accompanied by *** who supplement the history.  Follow up in *** months  *** pending on the above results  MRI brain to further evaluate for structural abnormalities and vascular load  Neuropsychological evaluation for further investigate other causes of memory loss including sleep, attention, anxiety, depression among others Check B12 TSH   Discussed the use of AI scribe software for clinical note transcription with the patient, who gave verbal consent to proceed.  History of Present Illness      How long did patient have memory difficulties?  For about.  Patient reports some difficulty remembering new information, recent conversations, names. repeats oneself?  Endorsed Disoriented when walking into a room? Denies ***  Leaving objects in unusual places?  Denies.   Wandering behavior? Denies.   Any personality changes, or depression, anxiety? Denies *** Hallucinations or paranoia? Denies.   Seizures? Denies.    Any sleep changes?  Sleeps well with trazodone*** Does not sleep well. **  frequent nightmares or dream reenactment, other REM behavior or sleepwalking   Sleep apnea?  Yes, on CPAP Any hygiene concerns?  Denies.   Independent of bathing and dressing? Endorsed  Does the patient need help with medications?  is in charge *** Who is in charge of the finances?  is in charge   *** Any changes in appetite?   Denies. ***   Patient have trouble  swallowing?  Denies.   Does the patient cook? No*** yes, denies forgetting common recipes or kitchen accidents *** Any headaches?  Denies.   Chronic pain? Denies.   Ambulates with difficulty? Denies. ***  Needs a cane*** Needs a walker *** to ambulate for stability.   Recent falls or head injuries? Denies.     Vision changes?  Denies any new issues.  Has a history of*** Any strokelike symptoms? Denies.   Any tremors? Denies. *** Any anosmia? Denies.   Any incontinence of urine?  History of urine retention due to BPH, he is on Flomax  and finasteride .  No nocturia Any bowel dysfunction? Denies.      Patient lives with ***  History of heavy alcohol intake? Denies.   History of heavy tobacco use? Denies.   Family history of dementia?   *** with dementia  Does patient drive? No longer drives  *** yes, denies getting lost.***   Pertinent labs October 2025 vitamin B12 542, vitamin D 23 (low normal), TSH 2.810  Plasma Biomarkers : ABeta 42 was 15.14. Abeta 40 was 146.12. pTau 42/40 ratio 0.104.  NFL 2.72. pTau 181 1.24. pTau 217 .  These labs are not consistent with the presence of Alzheimer's disease   Allergies[1]  Current Outpatient Medications  Medication Instructions   amitriptyline (ELAVIL) 25-50 mg, Daily at bedtime   amLODipine  (NORVASC ) 5 mg, Oral, Daily   ascorbic acid (VITAMIN C) 500 mg, Daily   atorvastatin  (LIPITOR) 40 mg, Daily   Cholecalciferol (VITAMIN D-3 PO) 2,000 Units, Daily   Cyanocobalamin  (VITAMIN B-12 PO) 1  tablet, Daily   finasteride  (PROSCAR ) 5 mg, Oral, Daily   furosemide  (LASIX ) 40 mg, Oral, Daily   HYDROcodone -acetaminophen  (NORCO/VICODIN) 5-325 MG tablet 1 tablet, Oral, Every 6 hours PRN   ibuprofen  (ADVIL ) 800 mg, Oral, Every 8 hours PRN   losartan  (COZAAR ) 50 mg, Oral, 2 times daily   nitroGLYCERIN (NITROSTAT) 0.4 MG SL tablet 1 tablet, Every 5 min x3 PRN   omeprazole (PRILOSEC) 20 mg, 2 times daily before meals   potassium chloride  (KLOR-CON  M) 10 MEQ  tablet 10 mEq, Oral, Daily   potassium chloride  (KLOR-CON ) 10 MEQ tablet 10 mEq, Daily   predniSONE  (DELTASONE ) 10 mg, Oral, 2 times daily with meals   pregabalin (LYRICA) 75 mg, 2 times daily   pregabalin (LYRICA) 100 mg, 2 times daily   tamsulosin  (FLOMAX ) 0.4 mg, Oral, 2 times daily   traZODone (DESYREL) 50 mg, Daily at bedtime    VITALS:  There were no vitals filed for this visit.   Neurological Exam      No data to display              No data to display            Orientation:  Alert and oriented to person, place and not to time***. No aphasia or dysarthria. Fund of knowledge is appropriate. Recent and remote memory impaired.  Attention and concentration are reduced***.  Able to name objects and repeat phrases. *** Delayed recall  /5 .*** Cranial nerves: There is good facial symmetry. Extraocular muscles are intact and visual fields are full to confrontational testing. Speech is fluent and clear. No tongue deviation. Hearing is intact to conversational tone.*** Tone: Tone is good throughout. Sensation: Sensation is intact to light touch.  Vibration is intact at the bilateral big toe.  Coordination: The patient has no difficulty with RAM's or FNF bilaterally. Normal finger to nose  Motor: Strength is 5/5 in the bilateral upper and lower extremities. There is no pronator drift. There are no fasciculations noted. DTR's: Deep tendon reflexes are 2/4 bilaterally. Gait and Station: The patient is able to ambulate without difficulty. Gait is cautious and narrow. Stride length is normal. ***      Thank you for allowing us  the opportunity to participate in the care of this nice patient. Please do not hesitate to contact us  for any questions or concerns.   Total time spent on today's visit was *** minutes dedicated to this patient today, preparing to see patient, examining the patient, ordering tests and/or medications and counseling the patient, documenting clinical information in  the EHR or other health record, independently interpreting results and communicating results to the patient/family, discussing treatment and goals, answering patient's questions and coordinating care.  Cc:  Shona Norleen PEDLAR, MD  Camie Sevin 04/02/2024 6:30 AM      [1]  Allergies Allergen Reactions   Gabapentin    "

## 2024-04-05 ENCOUNTER — Encounter

## 2024-04-05 ENCOUNTER — Encounter: Payer: Self-pay | Admitting: Physician Assistant

## 2024-04-21 ENCOUNTER — Other Ambulatory Visit: Payer: Self-pay | Admitting: Cardiology
